# Patient Record
Sex: Male | Born: 1950 | Race: White | Hispanic: No
Health system: Southern US, Community
[De-identification: ages and names within clinical notes are randomized; demographics above are authoritative.]

## PROBLEM LIST (undated history)

## (undated) DIAGNOSIS — I10 Essential (primary) hypertension: Secondary | ICD-10-CM

## (undated) HISTORY — DX: Essential (primary) hypertension: I10

## (undated) HISTORY — PX: NO PAST SURGERIES: SHX2092

---

## 2010-10-08 ENCOUNTER — Emergency Department: Payer: Self-pay | Admitting: Emergency Medicine

## 2012-09-23 ENCOUNTER — Ambulatory Visit: Payer: Self-pay | Admitting: Physician Assistant

## 2017-01-22 ENCOUNTER — Ambulatory Visit
Admission: EM | Admit: 2017-01-22 | Discharge: 2017-01-22 | Disposition: A | Discharge status: Home / Self Care | Payer: Self-pay | Attending: Family Medicine | Admitting: Family Medicine

## 2017-01-22 ENCOUNTER — Other Ambulatory Visit: Payer: Self-pay

## 2017-01-22 ENCOUNTER — Ambulatory Visit (INDEPENDENT_AMBULATORY_CARE_PROVIDER_SITE_OTHER): Payer: Self-pay

## 2017-01-22 DIAGNOSIS — M79671 Pain in right foot: Secondary | ICD-10-CM

## 2017-01-22 MED ORDER — NAPROXEN 500 MG PO TABS: 500.0000 mg | ORAL_TABLET | Freq: Two times a day (BID) | ORAL | 0 refills | Status: DC | PRN

## 2017-01-22 NOTE — Discharge Instructions (Signed)
Xray was negative for fracture.  Rest.  Shoe inserts.  Use the medication as needed.  If you fail to improve, you will need to see podiatry.  Take care  Dr. Adriana Simasook

## 2017-01-22 NOTE — ED Triage Notes (Signed)
Pt reports 2 day hx of right foot pain. Denies injury. Pain is to center of foot. Limping gait in triage. Pain 10/10

## 2017-01-22 NOTE — ED Provider Notes (Signed)
[facility]   CSN: [id number] Arrival date & time: 01/22/17  1005  History   Chief Complaint Chief Complaint  Patient presents with  . Foot Pain   HPI  67 year old male presents with foot pain.  Patient reports a 2-day history of right foot pain.  Pain is located on the plantar aspect of his foot near the MTP joints.  He reports he is having difficulty walking.  Pain is 10/10 in severity.  No known inciting factor.  He does walk upwards of 5 miles a day.  No fall, trauma, injury.  No medications or interventions tried.  No reports of ankle pain.  No other associated symptoms.  No other complaints at this time.  PMH - No reported medical problems. Of note, BP elevated today.  Surgical Hx - No past surgeries.  Home Medications    Prior to Admission medications   Medication Sig Start Date End Date Taking? Authorizing Provider  naproxen (NAPROSYN) 500 MG tablet Take 1 tablet (500 mg total) by mouth 2 (two) times daily as needed for moderate pain. 01/22/17   Tommie Sams, DO   Family History Family History  Problem Relation Age of Onset  . Hypertension Mother   . Hypertension Father    Social History Social History   Tobacco Use  . Smoking status: Never Smoker  . Smokeless tobacco: Never Used  Substance Use Topics  . Alcohol use: No    Frequency: Never  . Drug use: No   Allergies   Patient has no known allergies.   Review of Systems Review of Systems  Constitutional: Negative.   Musculoskeletal:       Foot pain.   Physical Exam Triage Vital Signs ED Triage Vitals  Enc Vitals Group     BP 01/22/17 1019 (!) 179/94     Pulse Rate 01/22/17 1019 93     Resp 01/22/17 1019 20     Temp 01/22/17 1019 97.9 F (36.6 C)     Temp Source 01/22/17 1019 Oral     SpO2 01/22/17 1019 100 %     Weight 01/22/17 1015 170 lb (77.1 kg)     Height 01/22/17 1015 5\' 8"  (1.727 m)     Head Circumference --      Peak Flow --      Pain Score 01/22/17 1016 10     Pain Loc  --      Pain Edu? --      Excl. in GC? --    Updated Vital Signs BP (!) 179/94 (BP Location: Right Arm)   Pulse 93   Temp 97.9 F (36.6 C) (Oral)   Resp 20   Ht 5\' 8"  (1.727 m)   Wt 170 lb (77.1 kg)   SpO2 100%   BMI 25.85 kg/m   Physical Exam  Constitutional: He is oriented to person, place, and time. He appears well-developed and well-nourished. No distress.  Cardiovascular: Normal rate and regular rhythm.  No murmur heard. Pulmonary/Chest: Effort normal and breath sounds normal. He has no wheezes. He has no rales.  Musculoskeletal:  Right foot -hallux valgus noted.  Cannot appreciate pulses but normal cap refill.  Normal range of motion the ankle.  Nontender.  Patient has no discrete areas of tenderness of his foot.  He does have difficulty bearing weight on the forefoot.  Neurological: He is alert and oriented to person, place, and time.  Skin: Skin is warm. No rash noted.  Psychiatric: He has a normal  mood and affect. His behavior is normal.  Nursing note and vitals reviewed.    UC Treatments / Results  Labs (all labs ordered are listed, but only abnormal results are displayed) Labs Reviewed - No data to display  EKG  EKG Interpretation None       Radiology Dg Foot Complete Right  Result Date: 01/22/2017 CLINICAL DATA:  Right foot pain. EXAM: RIGHT FOOT COMPLETE - 3+ VIEW COMPARISON:  None FINDINGS: There is a hallux valgus deformity with advanced degenerative changes involving the first MTP joint. No acute fracture or subluxation identified. No radio-opaque foreign bodies or soft tissue calcifications. IMPRESSION: 1. No acute abnormality. 2. Marked first MTP joint osteoarthritis. Electronically Signed   By: Signa Kellaylor  Stroud M.D.   On: 01/22/2017 10:48    Procedures Procedures (including critical care time)  Medications Ordered in UC Medications - No data to display   Initial Impression / Assessment and Plan / UC Course  I have reviewed the triage vital  signs and the nursing notes.  Pertinent labs & imaging results that were available during my care of the patient were reviewed by me and considered in my medical decision making (see chart for details).     67 year old male presents with right foot pain.  X-ray negative for fracture but revealed first MTP joint also arthritis.  Advised rest and she will inserts.  Naproxen as directed.  Final Clinical Impressions(s) / UC Diagnoses   Final diagnoses:  Right foot pain    ED Discharge Orders        Ordered    naproxen (NAPROSYN) 500 MG tablet  2 times daily PRN     01/22/17 1100     Controlled Substance Prescriptions [place] Controlled Substance Registry consulted? Not Applicable   Tommie SamsCook, Liba Hulsey G, DO 01/22/17 1111

## 2018-12-13 ENCOUNTER — Emergency Department: Payer: Medicare Other

## 2018-12-13 ENCOUNTER — Inpatient Hospital Stay
Admission: EM | Admit: 2018-12-13 | Discharge: 2018-12-16 | DRG: 069 | Disposition: A | Discharge status: Home / Self Care | Payer: Medicare Other | Attending: Internal Medicine | Admitting: Internal Medicine

## 2018-12-13 ENCOUNTER — Other Ambulatory Visit: Payer: Self-pay

## 2018-12-13 DIAGNOSIS — F840 Autistic disorder: Secondary | ICD-10-CM | POA: Diagnosis present

## 2018-12-13 DIAGNOSIS — G459 Transient cerebral ischemic attack, unspecified: Secondary | ICD-10-CM

## 2018-12-13 DIAGNOSIS — F329 Major depressive disorder, single episode, unspecified: Secondary | ICD-10-CM | POA: Diagnosis present

## 2018-12-13 DIAGNOSIS — D72829 Elevated white blood cell count, unspecified: Secondary | ICD-10-CM | POA: Diagnosis not present

## 2018-12-13 DIAGNOSIS — R42 Dizziness and giddiness: Secondary | ICD-10-CM

## 2018-12-13 DIAGNOSIS — H5500 Unspecified nystagmus: Secondary | ICD-10-CM | POA: Diagnosis present

## 2018-12-13 DIAGNOSIS — Z20828 Contact with and (suspected) exposure to other viral communicable diseases: Secondary | ICD-10-CM | POA: Diagnosis present

## 2018-12-13 DIAGNOSIS — E785 Hyperlipidemia, unspecified: Secondary | ICD-10-CM | POA: Diagnosis present

## 2018-12-13 DIAGNOSIS — Z8249 Family history of ischemic heart disease and other diseases of the circulatory system: Secondary | ICD-10-CM

## 2018-12-13 DIAGNOSIS — Z79899 Other long term (current) drug therapy: Secondary | ICD-10-CM

## 2018-12-13 DIAGNOSIS — F609 Personality disorder, unspecified: Secondary | ICD-10-CM | POA: Diagnosis present

## 2018-12-13 DIAGNOSIS — I6503 Occlusion and stenosis of bilateral vertebral arteries: Secondary | ICD-10-CM

## 2018-12-13 DIAGNOSIS — I160 Hypertensive urgency: Secondary | ICD-10-CM | POA: Diagnosis present

## 2018-12-13 DIAGNOSIS — G450 Vertebro-basilar artery syndrome: Secondary | ICD-10-CM | POA: Diagnosis present

## 2018-12-13 LAB — CBC
HCT: 44.3 % (ref 39.0–52.0)
Hemoglobin: 15.5 g/dL (ref 13.0–17.0)
MCH: 30.7 pg (ref 26.0–34.0)
MCHC: 35 g/dL (ref 30.0–36.0)
MCV: 87.7 fL (ref 80.0–100.0)
Platelets: 304 10*3/uL (ref 150–400)
RBC: 5.05 MIL/uL (ref 4.22–5.81)
RDW: 12.6 % (ref 11.5–15.5)
WBC: 11 10*3/uL — ABNORMAL HIGH (ref 4.0–10.5)
nRBC: 0 % (ref 0.0–0.2)

## 2018-12-13 LAB — HEPATIC FUNCTION PANEL
ALT: 24 U/L (ref 0–44)
AST: 27 U/L (ref 15–41)
Albumin: 4.7 g/dL (ref 3.5–5.0)
Alkaline Phosphatase: 71 U/L (ref 38–126)
Bilirubin, Direct: <0.1 mg/dL (ref 0.0–0.2)
Total Bilirubin: 0.6 mg/dL (ref 0.3–1.2)
Total Protein: 8.3 g/dL — ABNORMAL HIGH (ref 6.5–8.1)

## 2018-12-13 LAB — BASIC METABOLIC PANEL
Anion gap: 12 (ref 5–15)
BUN: 25 mg/dL — ABNORMAL HIGH (ref 8–23)
CO2: 23 mmol/L (ref 22–32)
Calcium: 9.7 mg/dL (ref 8.9–10.3)
Chloride: 106 mmol/L (ref 98–111)
Creatinine, Ser: 1.03 mg/dL (ref 0.61–1.24)
GFR calc Af Amer: >60 mL/min (ref >60)
GFR calc non Af Amer: >60 mL/min (ref >60)
Glucose, Bld: 145 mg/dL — ABNORMAL HIGH (ref 70–99)
Potassium: 3.7 mmol/L (ref 3.5–5.1)
Sodium: 141 mmol/L (ref 135–145)

## 2018-12-13 LAB — TROPONIN I (HIGH SENSITIVITY)
Troponin I (High Sensitivity): 4 ng/L (ref <18)
Troponin I (High Sensitivity): 6 ng/L (ref <18)

## 2018-12-13 IMAGING — CT CT HEAD W/O CM
3 series · 16 of 47 positions shown, 19 images · non-contrast
Comparison: None.

CLINICAL DATA: Dizziness, nausea, vomiting

EXAM:
CT HEAD WITHOUT CONTRAST
TECHNIQUE: Contiguous axial images were obtained from the base of the skull
through the vertex without intravenous contrast.

[Series 3: head wo · axial · 0.42mm/px · z∈[+647,+772]mm · 10 of 31 slices shown, 13 images]
[im 3/31  brain]
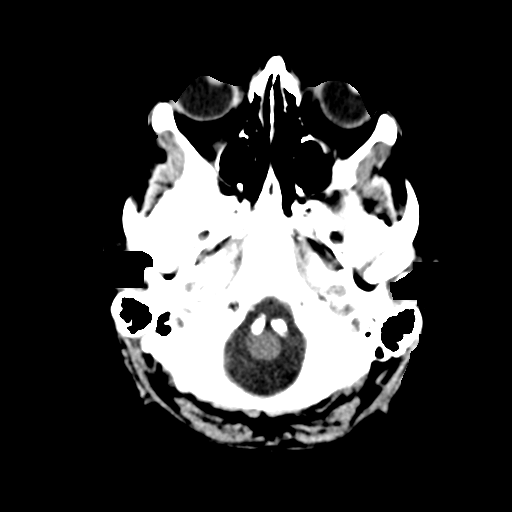
[im 3/31  bone]
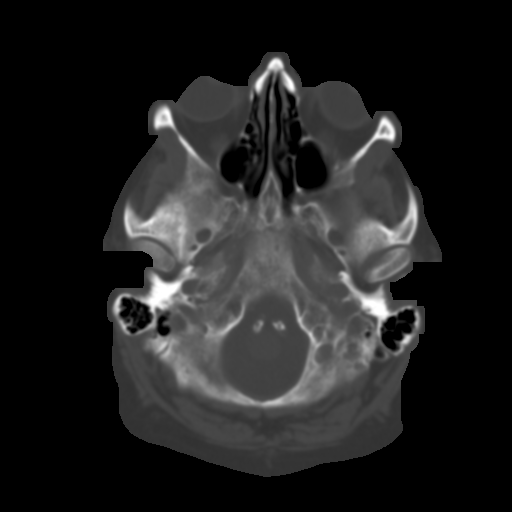
[im 6/31  brain]
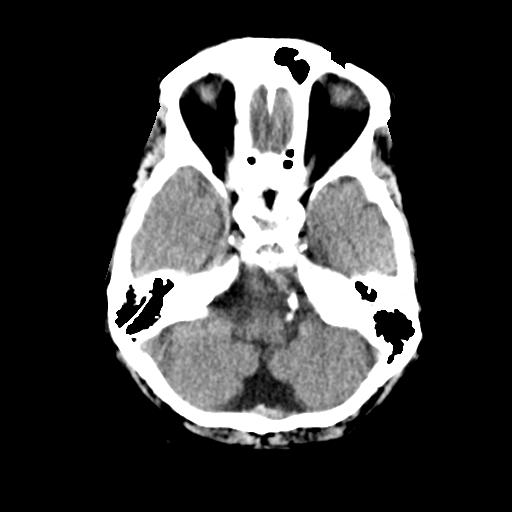
[im 9/31  brain]
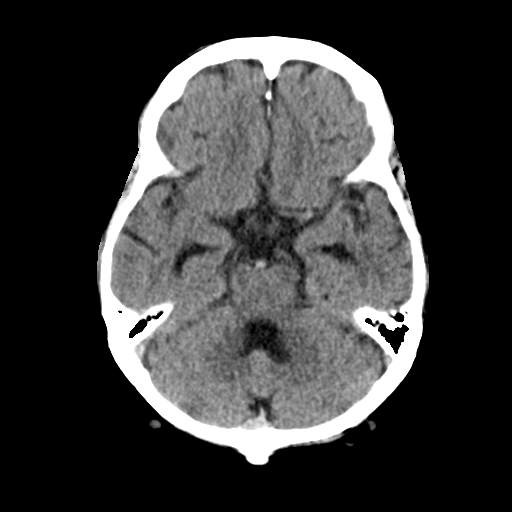
[im 11/31  brain]
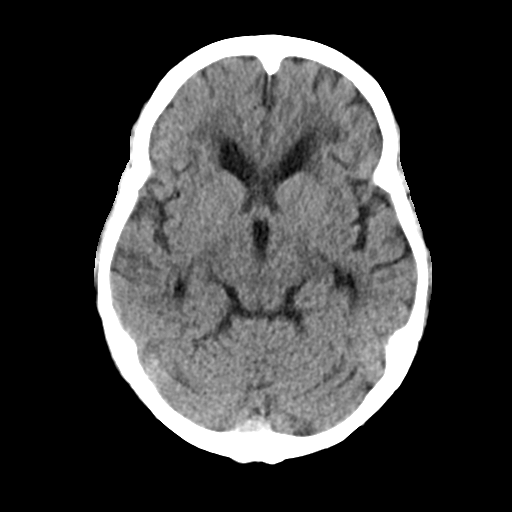
[im 14/31  brain]
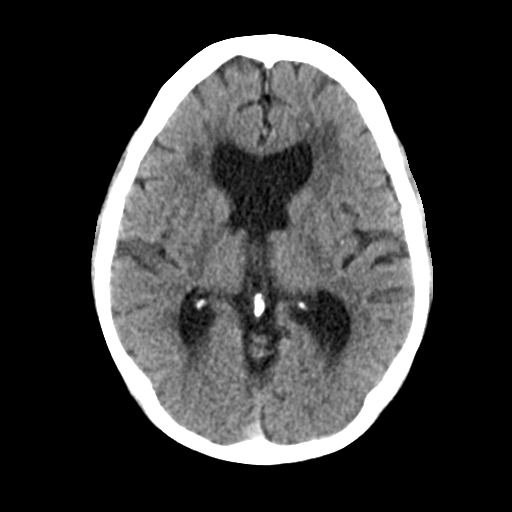
[im 14/31  bone]
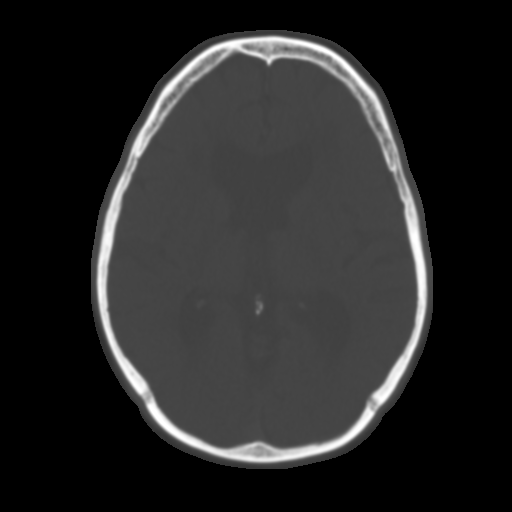
[im 17/31  brain]
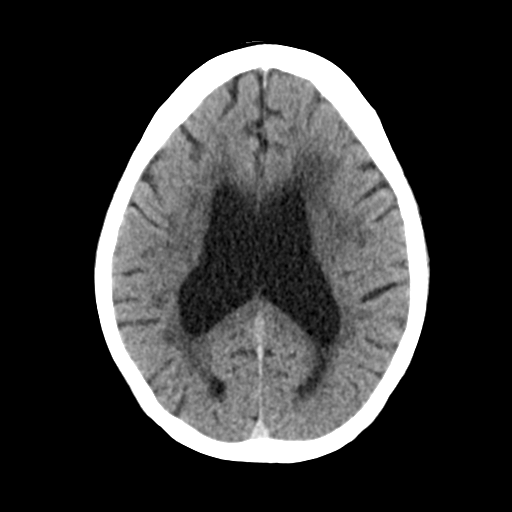
[im 20/31  brain]
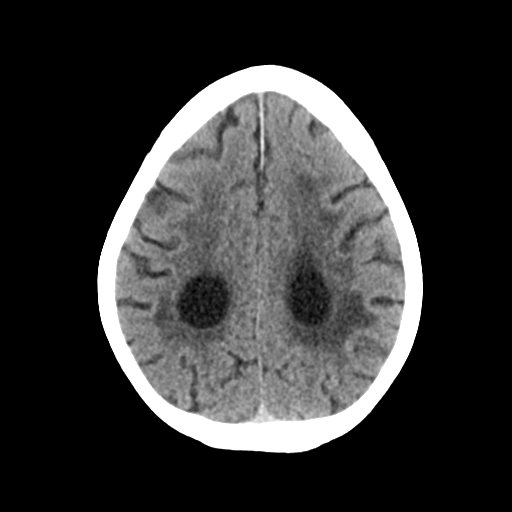
[im 23/31  brain]
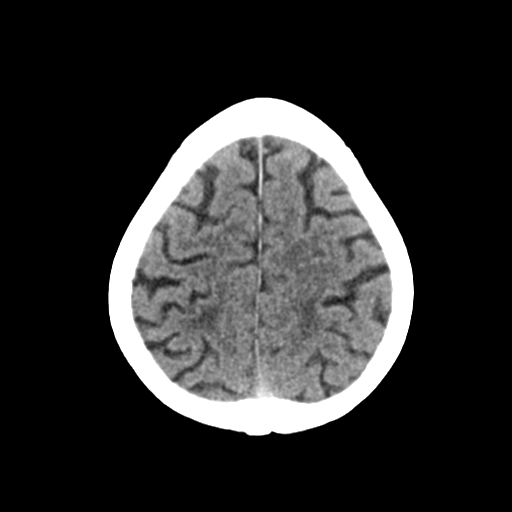
[im 25/31  brain]
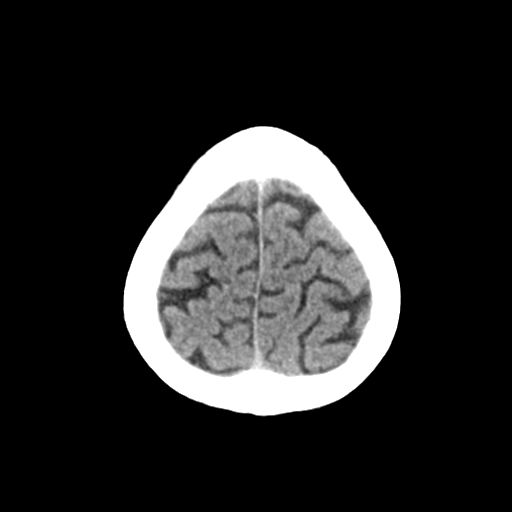
[im 25/31  bone]
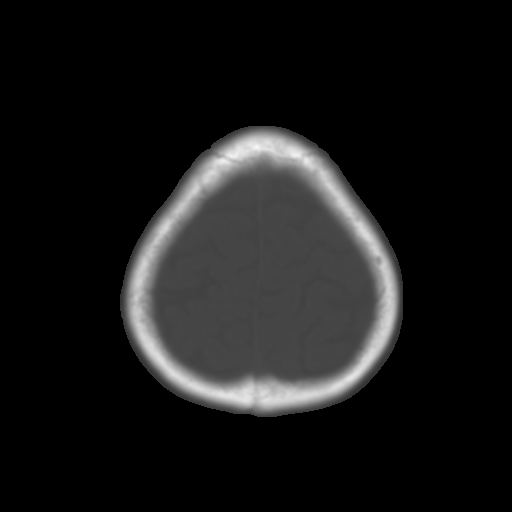
[im 28/31  brain]
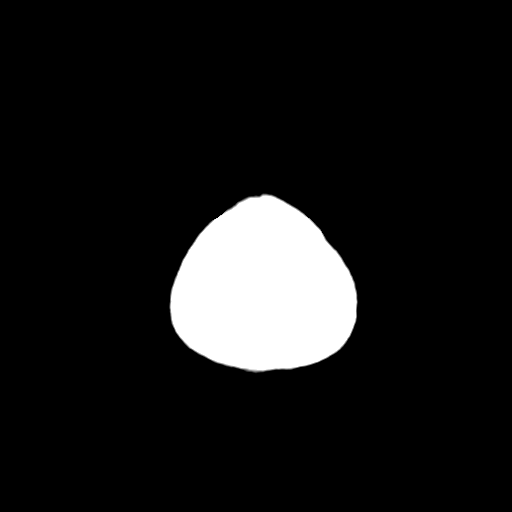

[Series 4: coronal soft tissue · coronal · 0.29mm/px · 3 of 63 slices shown]
[im 21/63  brain]
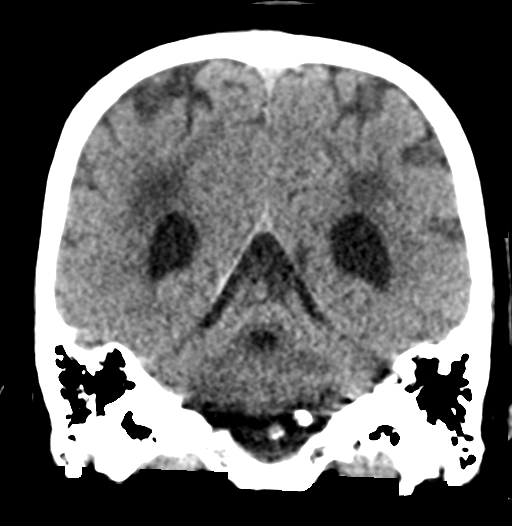
[im 28/63  brain]
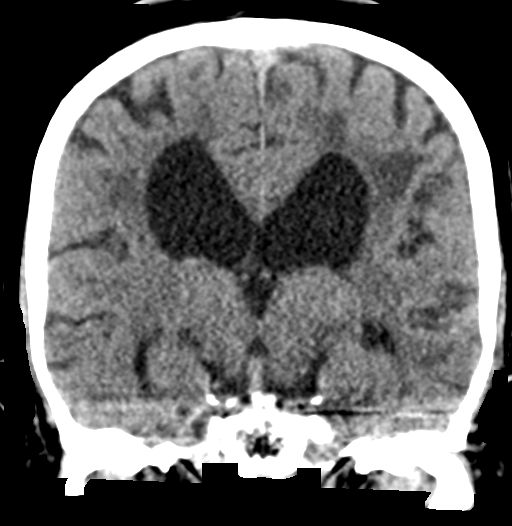
[im 35/63  brain]
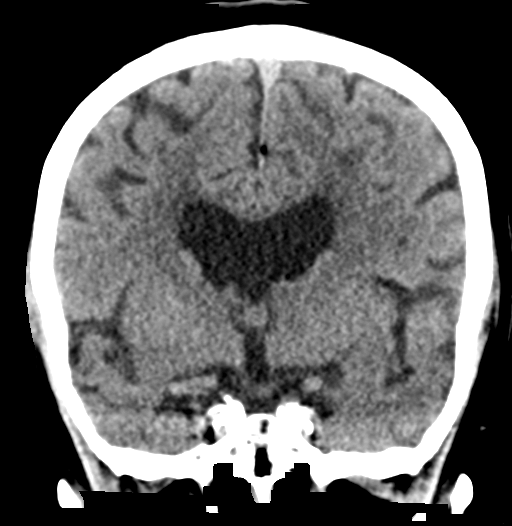

[Series 5: sagittal soft tissue · sagittal · 0.30mm/px · 3 of 51 slices shown]
[im 17/51  brain]
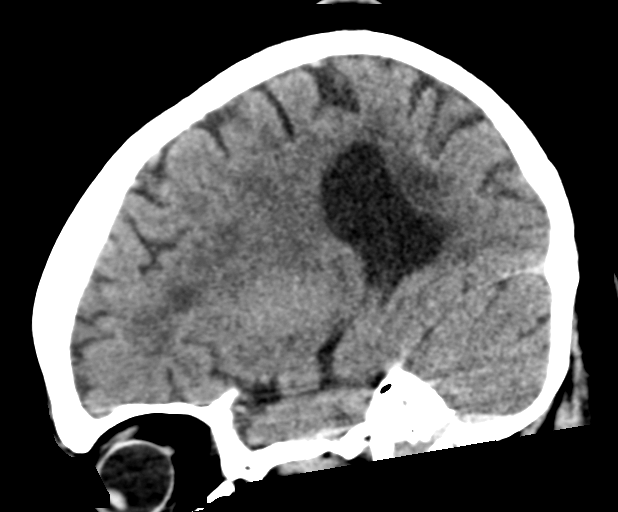
[im 26/51  brain]
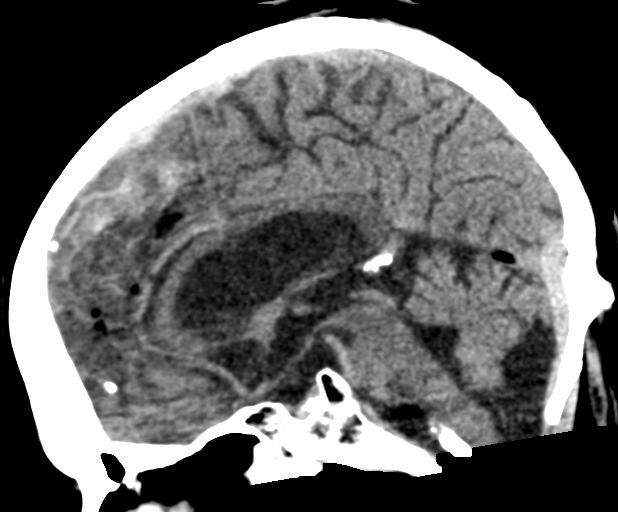
[im 34/51  brain]
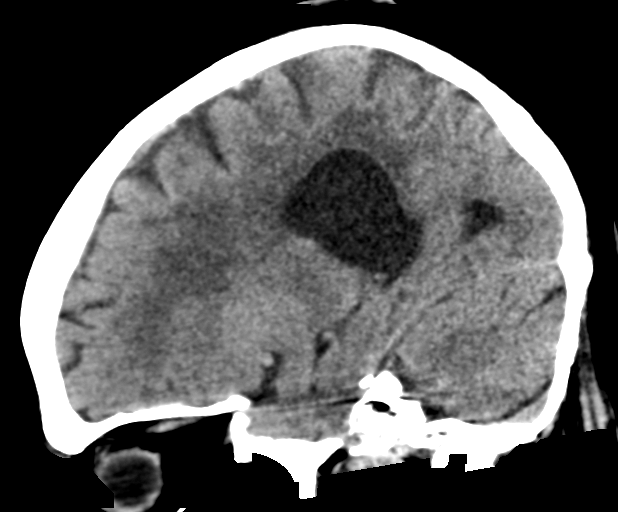

[16 of 47 positions shown; findings below may reference images not displayed]

FINDINGS: Brain: There is atrophy and chronic small vessel disease changes. No
acute intracranial abnormality. Specifically, no hemorrhage,
hydrocephalus, mass lesion, acute infarction, or significant
intracranial injury.

Vascular: No hyperdense vessel or unexpected calcification.

Skull: No acute calvarial abnormality.

Sinuses/Orbits: Visualized paranasal sinuses and mastoids clear.
Orbital soft tissues unremarkable.

Other: None
IMPRESSION: Atrophy, chronic microvascular disease.

No acute intracranial abnormality.

## 2018-12-13 MED ORDER — SODIUM CHLORIDE 0.9% FLUSH: 3.0000 mL | Freq: Once | INTRAVENOUS | Status: DC

## 2018-12-13 MED ORDER — MECLIZINE HCL 25 MG PO TABS
25.0000 mg | ORAL_TABLET | Freq: Once | ORAL | Status: AC
  Administered 2018-12-13: 21:00:00 25 mg via ORAL
  Filled 2018-12-13: qty 1

## 2018-12-13 MED ORDER — SODIUM CHLORIDE 0.9 % IV SOLN
1000.0000 mL | Freq: Once | INTRAVENOUS | Status: AC
  Administered 2018-12-13: 19:00:00 1000 mL via INTRAVENOUS

## 2018-12-13 MED ORDER — ONDANSETRON HCL 4 MG/2ML IJ SOLN
4.0000 mg | Freq: Once | INTRAMUSCULAR | Status: AC
  Administered 2018-12-13: 22:00:00 4 mg via INTRAVENOUS
  Filled 2018-12-13: qty 2

## 2018-12-13 NOTE — ED Notes (Signed)
Neuro cart placed at bedside for consult.

## 2018-12-13 NOTE — ED Notes (Signed)
Pt vomiting as RN brought pt back to triage. Pt reporting this is the third time he has vomited today.

## 2018-12-13 NOTE — ED Notes (Signed)
Neuro consult called in.

## 2018-12-13 NOTE — ED Notes (Signed)
Pt reports no decrease in symptoms and st "I don't feel any better". Pt vomited x1 and was given medication to help, see MAR. Pt reports consistent dizziness.

## 2018-12-13 NOTE — ED Notes (Signed)
Teleneurologist at bedside 

## 2018-12-13 NOTE — ED Provider Notes (Signed)
[facility] Emergency Department Provider Note   ____________________________________________    I have reviewed the triage vital signs and the nursing notes.   HISTORY  Chief Complaint Dizziness     HPI Albert Leon is a 68 y.o. male who presents with complaints of dizziness.  Patient reports that approximately 1 PM he left the local minim[place] and was going to walk back home when he felt his vision go blurry and "his head was buzzing "and he became very dizzy.  He says that he was so dizzy that he could hardly walk.  He does not describe lightheadedness more vertigo-like symptoms.  He also reports nausea and vomiting.  No history of vertigo in the past.  No extremity weakness or sensory changes  History reviewed. No pertinent past medical history.  Patient Active Problem List   Diagnosis Date Noted  . Vertebral artery narrowing, bilateral   . Dizziness   . TIA (transient ischemic attack) 12/14/2018    History reviewed. No pertinent surgical history.  Prior to Admission medications   Medication Sig Start Date End Date Taking? Authorizing Provider  amLODipine (NORVASC) 10 MG tablet Take 1 tablet (10 mg total) by mouth daily. 12/17/18   Tresa Moore, MD  aspirin EC 81 MG EC tablet Take 1 tablet (81 mg total) by mouth daily. 12/17/18   Tresa Moore, MD  carvedilol (COREG) 12.5 MG tablet Take 1 tablet (12.5 mg total) by mouth 2 (two) times daily. 12/16/18 12/16/19  Tresa Moore, MD  clopidogrel (PLAVIX) 75 MG tablet Take 1 tablet (75 mg total) by mouth daily. 12/17/18   Tresa Moore, MD  rosuvastatin (CRESTOR) 40 MG tablet Take 1 tablet (40 mg total) by mouth daily. 12/16/18   Tresa Moore, MD     Allergies Patient has no known allergies.  Family History  Problem Relation Age of Onset  . Hypertension Mother   . Hypertension Father     Social History Social History   Tobacco Use  . Smoking status: Never  Smoker  . Smokeless tobacco: Never Used  Substance Use Topics  . Alcohol use: No    Frequency: Never  . Drug use: No    Review of Systems  Constitutional: No fever/chills Eyes: As above ENT: No sore throat. Cardiovascular: Denies chest pain. Respiratory: Denies shortness of breath. Gastrointestinal: No abdominal pain.  Positive nausea Genitourinary: Negative for dysuria. Musculoskeletal: Negative for back pain. Skin: Negative for rash. Neurological: "Buzzing in head ", ataxia   ____________________________________________   PHYSICAL EXAM:  VITAL SIGNS: ED Triage Vitals  Enc Vitals Group     BP 12/13/18 1624 (!) 184/81     Pulse Rate 12/13/18 1624 63     Resp 12/13/18 1624 18     Temp 12/13/18 1624 97.8 F (36.6 C)     Temp Source 12/13/18 1624 Oral     SpO2 12/13/18 1624 98 %     Weight 12/13/18 1621 74.8 kg (165 lb)     Height 12/13/18 1621 1.727 m (5\' 8" )     Head Circumference --      Peak Flow --      Pain Score 12/13/18 1621 0     Pain Loc --      Pain Edu? --      Excl. in GC? --     Constitutional: Alert and oriented.  Eyes: Conjunctivae are normal.  EOMI, horizontal nystagmus noted, PERRLA Head: Atraumatic. Nose: No congestion/rhinnorhea. Mouth/Throat:  Mucous membranes are moist.   Neck:  Painless ROM Cardiovascular: Normal rate, regular rhythm. Grossly normal heart sounds.  Good peripheral circulation. Respiratory: Normal respiratory effort.  No retractions. Gastrointestinal: Soft and nontender. No distention.  No CVA tenderness. Genitourinary: deferred Musculoskeletal: Normal strength in all extremities Neurologic:  Normal speech and language. No gross focal neurologic deficits are appreciated.  No dysdiadochokinesis, patient is able to stand but feels unsteady with walking, cranial nerves II to XII are normal Skin:  Skin is warm, dry and intact. No rash noted. Psychiatric: Mood and affect are normal. Speech and behavior are normal.   ____________________________________________   LABS (all labs ordered are listed, but only abnormal results are displayed)  Labs Reviewed  BASIC METABOLIC PANEL - Abnormal; Notable for the following components:      Result Value   Glucose, Bld 145 (*)    BUN 25 (*)    All other components within normal limits  CBC - Abnormal; Notable for the following components:   WBC 11.0 (*)    All other components within normal limits  URINALYSIS, COMPLETE (UACMP) WITH MICROSCOPIC - Abnormal; Notable for the following components:   Color, Urine YELLOW (*)    APPearance CLEAR (*)    Hgb urine dipstick SMALL (*)    Ketones, ur 20 (*)    All other components within normal limits  HEPATIC FUNCTION PANEL - Abnormal; Notable for the following components:   Total Protein 8.3 (*)    All other components within normal limits  LIPID PANEL - Abnormal; Notable for the following components:   Cholesterol 227 (*)    LDL Cholesterol 167 (*)    All other components within normal limits  SARS CORONAVIRUS 2 (TAT 6-24 HRS)  URINE DRUG SCREEN, QUALITATIVE ([facility] ONLY)  HIV ANTIBODY (ROUTINE TESTING W REFLEX)  HEMOGLOBIN A1C  CBC  CREATININE, SERUM  TROPONIN I (HIGH SENSITIVITY)  TROPONIN I (HIGH SENSITIVITY)   ____________________________________________  EKG  ED ECG REPORT I, Lavonia Drafts, the attending physician, personally viewed and interpreted this ECG.  Date: 12/13/2018  Rhythm: normal sinus rhythm QRS Axis: normal Intervals: normal ST/T Wave abnormalities: normal Narrative Interpretation: no evidence of acute ischemia  ____________________________________________  RADIOLOGY  CT head unremarkable ____________________________________________   PROCEDURES  Procedure(s) performed: No  Procedures   Critical Care performed: No ____________________________________________   INITIAL IMPRESSION / ASSESSMENT AND PLAN / ED COURSE  Pertinent labs & imaging results that were  available during my care of the patient were reviewed by me and considered in my medical decision making (see chart for details).  Patient presents with dizziness which seems most compatible with vertigo, relatively acute onset, no history of similar episodes.  Differential also includes CVA.  Neurologically intact, CT head overall reassuring.  However given age history of high blood pressure, enough concern for CVA we will obtain MRI.  Will treat with meclizine, IV fluids to see if this helps while we await further imaging.  Clinical Course as of Dec 15 2005  Mon Dec 13, 2018  2114 Hepatic function panel [SF]  2239 MR BRAIN WO CONTRAST [SF]    Clinical Course User Index [SF] Caryn Section Linden Dolin, PA-C   ____________________________________________   FINAL CLINICAL IMPRESSION(S) / ED DIAGNOSES  Final diagnoses:  Dizziness  Vertebral artery narrowing, bilateral  Vertebral artery insufficiency        Note:  This document was prepared using Dragon voice recognition software and may include unintentional dictation errors.   Lavonia Drafts, MD 12/16/18  2009  

## 2018-12-13 NOTE — ED Notes (Addendum)
Pt gave verbal permission to this RN to update said pt on his current condition.  Per family, the patient has mild autism and at times has trouble focusing however is able to take care of himself independently. Family notes that they had contact with him a few days prior and did not notice any changes from him baseline

## 2018-12-13 NOTE — Consult Note (Signed)
[facility] Tele[facility]urology Consult Services  Stat Consult  Date of Service:   12/13/2018 22:36:16  Impression:     .  Rule Out Acute Ischemic Stroke  Comments/Sign-Out: rt gaze nystagmus, diplopia on rt gaze MRI negative for restricted diffusion # evaluate for vertebrobasilar insufficiency (pt has heavily calcified b/l vertebral arteries) # Evaluate for acute ischemic stroke # Evaluate for peripherral vertigo # Evaluate for toxic metabolic encephalopathy # cerebral small vessel ischemi  CT HEAD: Showed No Acute Hemorrhage or Acute Core Infarct MRI brain: 1. Negative for acute infarct or acute intracranial abnormality. 2. Heavily calcified and dolichoectatic distal vertebral arteries. There is no evidence of a prior posterior circulation infarct, but consider the possibility of vertebrobasilar insufficiency in this clinical setting. 3. Moderate for age nonspecific cerebral white matter signal changes, most commonly due to chronic small vessel disease  Metrics: [facility] Notification Time: 12/13/2018 22:33:37 Stamp Time: 12/13/2018 22:36:16 Callback Response Time: 12/13/2018 22:37:20 Video Start Time: 12/13/2018 23:01:35 Video End Time: 12/13/2018 14:48:18  Our recommendations are outlined below.  Recommendations:     .  Initiate Aspirin 81 MG Daily     .  Initiate Plavix 75 MG Daily     .  Repat MRI after a couple of days as delayed restricted diffusion can happen with posterior circulation strokes     .  CTA head and neck or MRA head and neck wo     .  vascular surgery consult     .  Lipitor 80  Imaging Studies:     .  Echocardiogram - Transthoracic Echocardiogram  Therapies:     .  Physical Therapy, Occupational Therapy, Speech Therapy Assessment When Applicable  Other WorkUp:     .  Infectious/metabolic workup per primary team  Disposition: Neurology Follow Up Recommended  Sign Out:     .  Discussed with Emergency Department  Provider  ----------------------------------------------------------------------------------------------------  Chief Complaint: Dizzy episode  History of Present Illness: Patient is a 68 year old Male.  h/o mild Autism, Dizzy episode with "burning sensation' in his brain. Pt is c/o Vomiting, and blurred vison. He is being treated symptomatically with Meclizine in the ED. Pt was walking when his symtoms started. He sat down and then started seeing double. Pt says he has chronic VFD on the left Upper visiual field CT head: negative. MRI Head: completed.   Past Medical History:     . Hypertension     . Hyperlipidemia   Examination: BP(142/80), Pulse(76), Blood Glucose(135) 1A: Level of Consciousness - Alert; keenly responsive + 0 1B: Ask Month and Age - Both Questions Right + 0 1C: Blink Eyes & Squeeze Hands - Performs Both Tasks + 0 2: Test Horizontal Extraocular Movements - Normal + 0 3: Test Visual Fields - Partial Hemianopia + 1 4: Test Facial Palsy (Use Grimace if Obtunded) - Normal symmetry + 0 5A: Test Left Arm Motor Drift - No Drift for 10 Seconds + 0 5B: Test Right Arm Motor Drift - No Drift for 10 Seconds + 0 6A: Test Left Leg Motor Drift - No Drift for 5 Seconds + 0 6B: Test Right Leg Motor Drift - No Drift for 5 Seconds + 0 7: Test Limb Ataxia (FNF/Heel-Shin) - No Ataxia + 0 8: Test Sensation - Normal; No sensory loss + 0 9: Test Language/Aphasia - Normal; No aphasia + 0 10: Test Dysarthria - Normal + 0 11: Test Extinction/Inattention - No abnormality + 0  NIHSS Score: 1  Patient/Family was informed  the Neurology Consult would happen via TeleHealth consult by way of interactive audio and video telecommunications and consented to receiving care in this manner.  Due to the immediate potential for life-threatening deterioration due to underlying acute neurologic illness, I spent 35 minutes providing critical care. This time includes time for face to face visit via  telemedicine, review of medical records, imaging studies and discussion of findings with providers, the patient and/or family.   Dr Suzi Roots Marlayna Bannister   [facility] [phone]   Case [id number]

## 2018-12-13 NOTE — ED Notes (Signed)
Pt to MRI

## 2018-12-13 NOTE — ED Provider Notes (Signed)
Receiving care from Dr. Sela Hilding, concerns of a stroke.  Patient has not had any relief with the meclizine for his dizziness. Physical Exam  BP (!) 123/93 (BP Location: Left Arm)   Pulse 71   Temp 97.8 F (36.6 C) (Oral)   Resp 16   Ht 5\' 8"  (1.727 m)   Wt 74.8 kg   SpO2 100%   BMI 25.09 kg/m   Physical Exam patient still has severe nystagmus of the right eye, no slurred speech  ED Course/Procedures   Clinical Course as of Dec 12 2245  [facility] Dec 13, 2018  2114 Hepatic function panel [SF]  2239 MR BRAIN WO CONTRAST [SF]    Clinical Course User Index [SF] Versie Starks, PA-C    Procedures  MDM  Due to the MRI showing a questionable vestibular basilar insufficiency , I discussed the case with Dr. Charna Archer.  He suggested I call neurology.  We consulted neurology who suggested we can use teleneurology.  I added additional labs.  Patient was moved to the major side room 3.  Report given to the physician.     Versie Starks, PA-C 12/13/18 2249    Blake Divine, MD 12/23/18 [phone]

## 2018-12-13 NOTE — ED Notes (Signed)
Patient transported to CT 

## 2018-12-13 NOTE — ED Notes (Signed)
Introduced patient to self, explained to patient that prior RN called tele-neuro and cart is at bedside, neurologist should be coming on any time now

## 2018-12-13 NOTE — ED Triage Notes (Signed)
Pt comes into the ED via EMS , pt was walking home in [place] and had sudden onset dizziness with N/V. B/p 142/80, 76HR, temp 99.1, 97% RA, CBG 135

## 2018-12-14 ENCOUNTER — Encounter: Payer: Self-pay | Admitting: Radiology

## 2018-12-14 ENCOUNTER — Inpatient Hospital Stay (HOSPITAL_COMMUNITY)
Admit: 2018-12-14 | Discharge: 2018-12-14 | Disposition: A | Discharge status: Home / Self Care | Payer: Medicare Other | Attending: Internal Medicine | Admitting: Internal Medicine

## 2018-12-14 ENCOUNTER — Emergency Department: Payer: Medicare Other

## 2018-12-14 DIAGNOSIS — Z20828 Contact with and (suspected) exposure to other viral communicable diseases: Secondary | ICD-10-CM | POA: Diagnosis present

## 2018-12-14 DIAGNOSIS — G459 Transient cerebral ischemic attack, unspecified: Secondary | ICD-10-CM | POA: Diagnosis present

## 2018-12-14 DIAGNOSIS — F329 Major depressive disorder, single episode, unspecified: Secondary | ICD-10-CM | POA: Diagnosis present

## 2018-12-14 DIAGNOSIS — I351 Nonrheumatic aortic (valve) insufficiency: Secondary | ICD-10-CM | POA: Diagnosis not present

## 2018-12-14 DIAGNOSIS — F609 Personality disorder, unspecified: Secondary | ICD-10-CM | POA: Diagnosis present

## 2018-12-14 DIAGNOSIS — F840 Autistic disorder: Secondary | ICD-10-CM | POA: Diagnosis present

## 2018-12-14 DIAGNOSIS — G450 Vertebro-basilar artery syndrome: Secondary | ICD-10-CM | POA: Diagnosis present

## 2018-12-14 DIAGNOSIS — D72829 Elevated white blood cell count, unspecified: Secondary | ICD-10-CM | POA: Diagnosis not present

## 2018-12-14 DIAGNOSIS — Z79899 Other long term (current) drug therapy: Secondary | ICD-10-CM | POA: Diagnosis not present

## 2018-12-14 DIAGNOSIS — R42 Dizziness and giddiness: Secondary | ICD-10-CM | POA: Diagnosis present

## 2018-12-14 DIAGNOSIS — I160 Hypertensive urgency: Secondary | ICD-10-CM | POA: Diagnosis present

## 2018-12-14 DIAGNOSIS — H5500 Unspecified nystagmus: Secondary | ICD-10-CM | POA: Diagnosis present

## 2018-12-14 DIAGNOSIS — Z8249 Family history of ischemic heart disease and other diseases of the circulatory system: Secondary | ICD-10-CM | POA: Diagnosis not present

## 2018-12-14 DIAGNOSIS — E785 Hyperlipidemia, unspecified: Secondary | ICD-10-CM | POA: Diagnosis present

## 2018-12-14 DIAGNOSIS — I340 Nonrheumatic mitral (valve) insufficiency: Secondary | ICD-10-CM

## 2018-12-14 DIAGNOSIS — I6503 Occlusion and stenosis of bilateral vertebral arteries: Secondary | ICD-10-CM | POA: Diagnosis not present

## 2018-12-14 LAB — CBC
HCT: 40.5 % (ref 39.0–52.0)
Hemoglobin: 14 g/dL (ref 13.0–17.0)
MCH: 30.3 pg (ref 26.0–34.0)
MCHC: 34.6 g/dL (ref 30.0–36.0)
MCV: 87.7 fL (ref 80.0–100.0)
Platelets: 279 10*3/uL (ref 150–400)
RBC: 4.62 MIL/uL (ref 4.22–5.81)
RDW: 12.7 % (ref 11.5–15.5)
WBC: 8.8 10*3/uL (ref 4.0–10.5)
nRBC: 0 % (ref 0.0–0.2)

## 2018-12-14 LAB — ECHOCARDIOGRAM COMPLETE
Height: 68 in
Weight: 2640 oz

## 2018-12-14 LAB — LIPID PANEL
Cholesterol: 227 mg/dL — ABNORMAL HIGH (ref 0–200)
HDL: 45 mg/dL (ref >40)
LDL Cholesterol: 167 mg/dL — ABNORMAL HIGH (ref 0–99)
Total CHOL/HDL Ratio: 5 RATIO
Triglycerides: 75 mg/dL (ref <150)
VLDL: 15 mg/dL (ref 0–40)

## 2018-12-14 LAB — URINALYSIS, COMPLETE (UACMP) WITH MICROSCOPIC
Bacteria, UA: NONE SEEN
Bilirubin Urine: NEGATIVE
Glucose, UA: NEGATIVE mg/dL
Ketones, ur: 20 mg/dL — AB
Leukocytes,Ua: NEGATIVE
Nitrite: NEGATIVE
Protein, ur: NEGATIVE mg/dL
Specific Gravity, Urine: 1.018 (ref 1.005–1.030)
Squamous Epithelial / LPF: NONE SEEN (ref 0–5)
pH: 6 (ref 5.0–8.0)

## 2018-12-14 LAB — URINE DRUG SCREEN, QUALITATIVE (ARMC ONLY)
Amphetamines, Ur Screen: NOT DETECTED
Barbiturates, Ur Screen: NOT DETECTED
Benzodiazepine, Ur Scrn: NOT DETECTED
Cannabinoid 50 Ng, Ur ~~LOC~~: NOT DETECTED
Cocaine Metabolite,Ur ~~LOC~~: NOT DETECTED
MDMA (Ecstasy)Ur Screen: NOT DETECTED
Methadone Scn, Ur: NOT DETECTED
Opiate, Ur Screen: NOT DETECTED
Phencyclidine (PCP) Ur S: NOT DETECTED
Tricyclic, Ur Screen: NOT DETECTED

## 2018-12-14 LAB — CREATININE, SERUM
Creatinine, Ser: 0.71 mg/dL (ref 0.61–1.24)
GFR calc Af Amer: >60 mL/min (ref >60)
GFR calc non Af Amer: >60 mL/min (ref >60)

## 2018-12-14 LAB — SARS CORONAVIRUS 2 (TAT 6-24 HRS): SARS Coronavirus 2: NEGATIVE

## 2018-12-14 LAB — HIV ANTIBODY (ROUTINE TESTING W REFLEX): HIV Screen 4th Generation wRfx: NONREACTIVE

## 2018-12-14 LAB — HEMOGLOBIN A1C
Hgb A1c MFr Bld: 5.2 % (ref 4.8–5.6)
Mean Plasma Glucose: 102.54 mg/dL

## 2018-12-14 MED ORDER — HEPARIN SODIUM (PORCINE) 5000 UNIT/ML IJ SOLN
5000.0000 [IU] | Freq: Three times a day (TID) | INTRAMUSCULAR | Status: DC
  Administered 2018-12-14 – 2018-12-16 (×7): 5000 [IU] via SUBCUTANEOUS
  Filled 2018-12-14 (×10): qty 1

## 2018-12-14 MED ORDER — SENNOSIDES-DOCUSATE SODIUM 8.6-50 MG PO TABS: 1.0000 | ORAL_TABLET | Freq: Every evening | ORAL | Status: DC | PRN

## 2018-12-14 MED ORDER — STROKE: EARLY STAGES OF RECOVERY BOOK: Freq: Once | Status: DC

## 2018-12-14 MED ORDER — ACETAMINOPHEN 650 MG RE SUPP: 650.0000 mg | RECTAL | Status: DC | PRN

## 2018-12-14 MED ORDER — CLOPIDOGREL BISULFATE 75 MG PO TABS
75.0000 mg | ORAL_TABLET | Freq: Every day | ORAL | Status: DC
  Administered 2018-12-14 – 2018-12-16 (×3): 75 mg via ORAL
  Filled 2018-12-14 (×4): qty 1

## 2018-12-14 MED ORDER — ACETAMINOPHEN 325 MG PO TABS: 650.0000 mg | ORAL_TABLET | ORAL | Status: DC | PRN

## 2018-12-14 MED ORDER — ONDANSETRON HCL 4 MG/2ML IJ SOLN
4.0000 mg | Freq: Four times a day (QID) | INTRAMUSCULAR | Status: DC | PRN
  Administered 2018-12-14: 4 mg via INTRAVENOUS
  Filled 2018-12-14: qty 2

## 2018-12-14 MED ORDER — ASPIRIN EC 81 MG PO TBEC
81.0000 mg | DELAYED_RELEASE_TABLET | Freq: Every day | ORAL | Status: DC
  Administered 2018-12-14 – 2018-12-16 (×3): 81 mg via ORAL
  Filled 2018-12-14 (×3): qty 1

## 2018-12-14 MED ORDER — ACETAMINOPHEN 160 MG/5ML PO SOLN
650.0000 mg | ORAL | Status: DC | PRN
  Filled 2018-12-14: qty 20.3

## 2018-12-14 MED ORDER — SODIUM CHLORIDE 0.9 % IV SOLN
INTRAVENOUS | Status: DC
  Administered 2018-12-14 – 2018-12-15 (×2): via INTRAVENOUS

## 2018-12-14 MED ORDER — IOHEXOL 350 MG/ML SOLN
75.0000 mL | Freq: Once | INTRAVENOUS | Status: AC | PRN
  Administered 2018-12-14: 75 mL via INTRAVENOUS
  Filled 2018-12-14: qty 75

## 2018-12-14 NOTE — Evaluation (Signed)
Occupational Therapy Evaluation Patient Details Name: Albert Leon MRN: [id number] DOB: July 20, 1950 Today's Date: 12/14/2018    History of Present Illness pt is a 68 yo male who presented to ED with complaints of dizziness and inability to stand upright, initial head CT negative, MRI negative however significant calcification in the posterior circulation, telemetry neuro noted right sided nystagmus and diplopia. PMH includes personality disorder and depression   Clinical Impression   Pt seen for OT evaluation this date. Prior to hospital admission, pt was I with all ADLs/IADLs with no AD.  Pt lives alone in apt.  Currently pt demonstrates impairments in standing balance and tolerance 2/2 dizziness requiring MIN A to CGA for LB ADLs/ADL mobility.   Pt would benefit from skilled OT to address noted impairments and functional limitations (see below for any additional details) in order to maximize safety and independence while minimizing falls risk and caregiver burden.  Upon hospital discharge, recommend pt discharge to home with intermittent supv from friends or family to ensure his safety.    Follow Up Recommendations  Supervision - Intermittent;No OT follow up    Equipment Recommendations  None recommended by OT    Recommendations for Other Services       Precautions / Restrictions Precautions Precautions: Fall Restrictions Weight Bearing Restrictions: No      Mobility Bed Mobility Overal bed mobility: Independent             General bed mobility comments: no assist needed to get EOB  Transfers         General transfer comment: pt with dizziness on bed mobility with OT and reports he becamce very dizzy on stand with PT. t/f's deferred on OT Assessment    Balance Overall balance assessment: Needs assistance Sitting-balance support: No upper extremity supported Sitting balance-Leahy Scale: Good     Standing balance comment: deferred                          ADL either performed or assessed with clinical judgement   ADL Overall ADL's : Needs assistance/impaired Eating/Feeding: Independent   Grooming: Wash/dry hands;Wash/dry face;Oral care;Set up;Sitting   Upper Body Bathing: Set up;Sitting   Lower Body Bathing: Min guard;Minimal assistance;Sit to/from stand Lower Body Bathing Details (indicate cue type and reason): for stability d/t dizziness based on clinical observation Upper Body Dressing : Independent;Set up;Sitting   Lower Body Dressing: Min guard;Minimal assistance;Sit to/from stand   Toilet Transfer: Min Art therapist Details (indicate cue type and reason): based on clinical observation                 Vision Baseline Vision/History: Wears glasses Wears Glasses: At all times Patient Visual Report: Diplopia;Other (comment)(nystagmus) Vision Assessment?: Yes Eye Alignment: Within Functional Limits Ocular Range of Motion: Within Functional Limits Alignment/Gaze Preference: Within Defined Limits Tracking/Visual Pursuits: Able to track stimulus in all quads without difficulty Saccades: Within functional limits Convergence: Within functional limits Diplopia Assessment: Other (comment)(pt with difficulty describing. States his diplopia comes and goes, states splits in different directions, further assessment needed.)     Perception     Praxis      Pertinent Vitals/Pain Pain Assessment: 0-10 Pain Score: 2  Faces Pain Scale: Hurts a little bit Pain Location: back of head where he reports feeling that his dizziness is stemming from Pain Descriptors / Indicators: Headache Pain Intervention(s): Monitored during session     Hand Dominance     Extremity/Trunk Assessment Upper  Extremity Assessment Upper Extremity Assessment: Overall WFL for tasks assessed;RUE deficits/detail;LUE deficits/detail RUE Deficits / Details: MMT grossly >3/5, gross and fine motor assesses and is intact and equal  bilaterally. LUE Deficits / Details: MMT grossly >3/5, gross and fine motor assesses and is intact and equal bilaterally.   Lower Extremity Assessment Lower Extremity Assessment: Defer to PT evaluation;Overall WFL for tasks assessed       Communication Communication Communication: No difficulties   Cognition Arousal/Alertness: Awake/alert Behavior During Therapy: WFL for tasks assessed/performed Overall Cognitive Status: Within Functional Limits for tasks assessed                                 General Comments: A&O x4   General Comments       Exercises Other Exercises Other Exercises: OT facilitates  education re: role of OT in acute setting. Pt demos good understanding.   Shoulder Instructions      Home Living Family/patient expects to be discharged to:: Private residence Living Arrangements: Alone Available Help at Discharge: Friend(s);Family;Available PRN/intermittently Type of Home: Apartment Home Access: Level entry     Home Layout: One level     Bathroom Shower/Tub: Chief Strategy Officer: Standard     Home Equipment: Grab bars - tub/shower          Prior Functioning/Environment Level of Independence: Independent        Comments: Pt states he was completely indepedent with ADLs/IADLs and walks versus driving. States he was working in [employer] prior to [employer]. Pt states he has since been finding work wherever he can and was cleaning a woman's house prior to calling EMS d/t severe dizziness.        OT Problem List: Decreased activity tolerance;Impaired balance (sitting and/or standing);Impaired vision/perception      OT Treatment/Interventions: Self-care/ADL training;Therapeutic exercise;Energy conservation;DME and/or AE instruction;Therapeutic activities;Patient/family education;Balance training    OT Goals(Current goals can be found in the care plan section) Acute Rehab OT Goals Patient Stated Goal: get  better OT Goal Formulation: With patient Time For Goal Achievement: 12/28/18 Potential to Achieve Goals: Good  OT Frequency: Min 1X/week   Barriers to D/C: Decreased caregiver support          Co-evaluation              AM-PAC OT "6 Clicks" Daily Activity     Outcome Measure Help from another person eating meals?: None Help from another person taking care of personal grooming?: None Help from another person toileting, which includes using toliet, bedpan, or urinal?: A Little Help from another person bathing (including washing, rinsing, drying)?: A Little Help from another person to put on and taking off regular upper body clothing?: None Help from another person to put on and taking off regular lower body clothing?: A Little 6 Click Score: 21   End of Session    Activity Tolerance: Patient tolerated treatment well Patient left: in bed;with call bell/phone within reach  OT Visit Diagnosis: Unsteadiness on feet (R26.81)                Time: 4235-3614 OT Time Calculation (min): 23 min Charges:  OT General Charges $OT Visit: 1 Visit OT Evaluation $OT Eval Low Complexity: 1 Low OT Treatments $Self Care/Home Management : 8-22 mins  Rejeana Brock, MS, OTR/L ascom [phone] 12/14/18, 3:57 PM

## 2018-12-14 NOTE — ED Provider Notes (Signed)
-----------------------------------------   12:16 AM on 12/14/2018 -----------------------------------------  Blood pressure (!) 184/88, pulse 78, temperature 97.8 F (36.6 C), temperature source Oral, resp. rate 16, height 5\' 8"  (1.727 m), weight 74.8 kg, SpO2 96 %.  Assuming care from Dr. Corky Downs.  In short, Albert Leon is a 68 y.o. male with a chief complaint of Dizziness .  Refer to the original H&P for additional details.  The current plan of care is to follow-up MRI of brain to assess for acute stroke to explain his dizziness.  MRI brain did not show any evidence of acute stroke but did show significant calcification and narrowing of bilateral vertebral arteries.  Neurology was consulted who recommended evaluation by teleneurology.  Teleneurologist recommended we obtain CTA of patient's head and neck, although there does not appear to be evidence of a large vessel occlusion given adequate flow on MRI.  While MRI does not show acute stroke, neurology states that this may not appear in areas of posterior circulation for some time after initial event.  He recommends repeat MRI in 2 to 3 days, starting aspirin and Plavix, and consideration of vascular surgery consult as patient may be a surgical candidate.  Case discussed with hospitalist, who accepts patient for admission.    Blake Divine, MD 12/14/18 [phone]

## 2018-12-14 NOTE — H&P (Addendum)
History and Physical    BINNIE VONDERHAAR WUJ:[id number] DOB: 09/03/1950 DOA: 12/13/2018  PCP: Patient, No Pcp Per  Patient coming from: Home  I have personally briefly reviewed patient's old medical records in [facility]  Chief Complaint:refractory acute vertigo.  HPI: PERL KERNEY is a 68 y.o. male with medical history significant of  Personality disorder/depression who presents to the ED with complaints of refractory acute vertigo.  Patient states that he was on his way home when he acutely became very dizzy stated that he was unable to stand upright and had to immediately sit.  He states that symptoms continue to persist and was associated with nausea but no emesis.  He states a passerby assisted him in calling EMS.  He stated that prior to the onset of dizziness he had been in his normal state of health.  He denies any recent URI signs and symptoms any fever chills shortness of breath or chest pain.  He denies any associated vision changes difficulty with swallowing focal weakness or paresthesias associated with these new symptoms.  Notes he is not had symptoms like this in the past.  ED Course:  In ED patient was slated as a code stroke his initial CT head was noted to be negative MRI of the head was also noted to be negative however there was significant calcification in the raise of the posterior circulation.  Evaluation by telemetry neuro due to continued symptoms noted right-sided nystagmus and diplopia there was concern that patient possibly had a posterior circulation event that was causing his current symptoms.  Telemetry neurology recommended admission for continued stroke evaluation.  It was also recommended to start patient on aspirin low-dose as well as Plavix in addition to obtaining a CTA to further evaluate posterior circulation. Pertinent laboratory data noted and negative urine drug tox urinalysis was also noted to be negative troponins also negative White count  noted to be 11  however no noted signs of infection. Review of Systems: As per HPI otherwise 10 point review of systems negative.   History reviewed. No pertinent past medical history. -Depression/personality disorder History reviewed. No pertinent surgical history.   reports that he has never smoked. He has never used smokeless tobacco. He reports that he does not drink alcohol or use drugs.  No Known Allergies  Family History  Problem Relation Age of Onset   Hypertension Mother    Hypertension Father       Sister with hypothyroidism  Prior to Admission medications   Medication Sig Start Date End Date Taking? Authorizing Provider  naproxen (NAPROSYN) 500 MG tablet Take 1 tablet (500 mg total) by mouth 2 (two) times daily as needed for moderate pain. Patient not taking: Reported on 12/14/2018 01/22/17   Tommie Sams, DO    Physical Exam: Vitals:   12/13/18 2300 12/14/18 0000 12/14/18 0015 12/14/18 0100  BP: (!) 184/88 (!) 159/97  (!) 168/93  Pulse: 78 84 82   Resp: 16   20  Temp:    97.6 F (36.4 C)  TempSrc:    Oral  SpO2: 96% 94% 95% 96%  Weight:      Height:        Constitutional: NAD, calm, comfortable Vitals:   12/13/18 2300 12/14/18 0000 12/14/18 0015 12/14/18 0100  BP: (!) 184/88 (!) 159/97  (!) 168/93  Pulse: 78 84 82   Resp: 16   20  Temp:    97.6 F (36.4 C)  TempSrc:  Oral  SpO2: 96% 94% 95% 96%  Weight:      Height:       Eyes: PERRL, lids and conjunctivae normal ENMT: Mucous membranes are moist. Posterior pharynx clear of any exudate or lesions.Normal dentition.  Neck: normal, supple, no masses, no thyromegaly Respiratory: clear to auscultation bilaterally, no wheezing, no crackles. Normal respiratory effort. No accessory muscle use.  Cardiovascular: Regular rate and rhythm, no murmurs / rubs / gallops. No extremity edema. 2+ pedal pulses. No carotid bruits.  Abdomen: no tenderness, no masses palpated. No hepatosplenomegaly. Bowel sounds  positive.  Musculoskeletal: no clubbing / cyanosis. No joint deformity upper and lower extremities. Good ROM, no contractures. Normal muscle tone.  Skin: no rashes, lesions, ulcers. No induration Neurologic: CN 2-12 grossly intact, + horizontal nystagmus`. Sensation intact, DTR normal. Strength 5/5 in all 4.  Psychiatric: Normal judgment and insight. Alert and oriented x 3. Normal mood.    Labs on Admission: I have personally reviewed following labs and imaging studies  CBC: Recent Labs  Lab 12/13/18 1647  WBC 11.0*  HGB 15.5  HCT 44.3  MCV 87.7  PLT 315   Basic Metabolic Panel: Recent Labs  Lab 12/13/18 1647  NA 141  K 3.7  CL 106  CO2 23  GLUCOSE 145*  BUN 25*  CREATININE 1.03  CALCIUM 9.7   GFR: Estimated Creatinine Clearance: 68.3 mL/min (by C-G formula based on SCr of 1.03 mg/dL). Liver Function Tests: Recent Labs  Lab 12/13/18 1647  AST 27  ALT 24  ALKPHOS 71  BILITOT 0.6  PROT 8.3*  ALBUMIN 4.7   No results for input(s): LIPASE, AMYLASE in the last 168 hours. No results for input(s): AMMONIA in the last 168 hours. Coagulation Profile: No results for input(s): INR, PROTIME in the last 168 hours. Cardiac Enzymes: No results for input(s): CKTOTAL, CKMB, CKMBINDEX, TROPONINI in the last 168 hours. BNP (last 3 results) No results for input(s): PROBNP in the last 8760 hours. HbA1C: No results for input(s): HGBA1C in the last 72 hours. CBG: No results for input(s): GLUCAP in the last 168 hours. Lipid Profile: No results for input(s): CHOL, HDL, LDLCALC, TRIG, CHOLHDL, LDLDIRECT in the last 72 hours. Thyroid Function Tests: No results for input(s): TSH, T4TOTAL, FREET4, T3FREE, THYROIDAB in the last 72 hours. Anemia Panel: No results for input(s): VITAMINB12, FOLATE, FERRITIN, TIBC, IRON, RETICCTPCT in the last 72 hours. Urine analysis:    Component Value Date/Time   COLORURINE YELLOW (A) 12/13/2018 2341   APPEARANCEUR CLEAR (A) 12/13/2018 2341    LABSPEC 1.018 12/13/2018 2341   PHURINE 6.0 12/13/2018 2341   GLUCOSEU NEGATIVE 12/13/2018 2341   HGBUR SMALL (A) 12/13/2018 2341   BILIRUBINUR NEGATIVE 12/13/2018 2341   KETONESUR 20 (A) 12/13/2018 2341   PROTEINUR NEGATIVE 12/13/2018 2341   NITRITE NEGATIVE 12/13/2018 2341   LEUKOCYTESUR NEGATIVE 12/13/2018 2341    Radiological Exams on Admission: Ct Angio Head W Or Wo Contrast  Result Date: 12/14/2018 CLINICAL DATA:  68 year old male with sudden onset dizziness with no acute infarct but heavily calcified ectatic distal vertebral arteries on head CT and MRI earlier today. EXAM: CT ANGIOGRAPHY HEAD AND NECK TECHNIQUE: Multidetector CT imaging of the head and neck was performed using the standard protocol during bolus administration of intravenous contrast. Multiplanar CT image reconstructions and MIPs were obtained to evaluate the vascular anatomy. Carotid stenosis measurements (when applicable) are obtained utilizing NASCET criteria, using the distal internal carotid diameter as the denominator. CONTRAST:  91mL OMNIPAQUE IOHEXOL  350 MG/ML SOLN COMPARISON:  brain MRI and head CT earlier tonight. FINDINGS: CT HEAD Brain: Stable gray-white matter differentiation throughout the brain. No acute intracranial abnormality. Calvarium and skull base: No acute osseous abnormality identified. Paranasal sinuses: Visualized paranasal sinuses and mastoids are stable and well pneumatized. Orbits: Visualized orbits and scalp soft tissues are within normal limits. CTA NECK Skeleton: Carious left mandible dentition. No acute osseous abnormality identified. Upper chest: Negative visible upper lungs aside from mild atelectasis. Mild mediastinal lipomatosis. No superior mediastinal lymphadenopathy. Other neck: No acute findings. Aortic arch: Calcified aortic atherosclerosis. 3 vessel arch configuration. Right carotid system: Tortuous brachiocephalic artery with mild plaque and no stenosis. Tortuous proximal right carotid  artery with a kinked appearance at the thoracic inlet but no other stenosis. Soft and calcified plaque at the right ICA origin and bulb with no associated stenosis. Tortuous right ICA just below the skull base. Left carotid system: Mildly tortuous proximal left CCA without stenosis. Partially retropharyngeal course. Calcified plaque at the left ICA origin resulting in less than 50 % stenosis with respect to the distal vessel. Tortuous left ICA just below the skull base. Vertebral arteries: Tortuous proximal right subclavian artery without stenosis. Normal right vertebral artery origin on series 10, image 537. Patent right vertebral artery to the skull base with tortuosity but no stenosis. Mild plaque in the proximal left subclavian artery without stenosis. Normal left vertebral artery origin. Tortuous left V1 segment with a kinked appearance but no other stenosis. Patent left vertebral artery to the skull base with mild tortuosity, no stenosis. CTA HEAD Posterior circulation: Heavily calcified bilateral vertebral artery V4 segments with radiographic string sign stenosis as seen on series 10, images 274, 271 and series 12, images 133 and 131. On the right the high-grade stenosis is proximal to the right PICA origin which remains patent on series 10, image 260. Both vertebral arteries do remain patent to the vertebrobasilar junction, and this despite a 2nd level of tandem string sign stenosis of the distal left V4 segment just proximal to the vertebrobasilar junction (series 12, image 122. Furthermore, there is a small 2 millimeter saccular aneurysm versus atherosclerotic pseudo-lesion of the left V4 segment seen on series 12, image 129. The left AICA appears to be dominant and patent. Patent basilar artery with mild irregularity but no stenosis. Patent SCA and PCA origins. Small posterior communicating arteries. Moderate to severe left PCA P2 segment stenosis on series 16, image 21. Preserved left PCA branch  enhancement. Mild to moderate contralateral right P2 stenosis (series 14, image 20) also with preserved distal enhancement. Anterior circulation: Both ICA siphons are patent. On the left there is moderate to severe calcified plaque and moderate to severe supraclinoid stenosis on series 10, image 219. On the right side there is similar extensive calcified plaque, with moderate to severe cavernous and severe supraclinoid right ICA stenoses (series 10, image 220). Normal ophthalmic and posterior communicating artery origins. Patent carotid termini. MCA and ACA origins are within normal limits. There is bilateral A1 segment irregularity without stenosis. Diminutive anterior communicating artery. Bilateral ACA branches are within normal limits. There is moderate irregularity in the left MCA M1 segment without stenosis. Left MCA bifurcation is patent. Left MCA branches appear patent with mild stenosis. On the right there is moderate to severe irregularity and at least moderate stenosis of the right MCA M1 segment (series 14, images 20 and 21). The right MCA bifurcation is patent but irregular with moderate to severe proximal M2 stenosis on series  15, image 18. However, no right MCA branch occlusion is identified. Venous sinuses: Patent. Anatomic variants: None. Review of the MIP images confirms the above findings IMPRESSION: 1. Negative for large vessel occlusion, but positive for high-grade RADIOGRAPHIC STRING SIGN stenoses due to bulky calcified plaque of: - Left vertebral artery V4 segment (tandem string sign stenoses). - Right Vertebral artery V4 segment. - Right ICA supraclinoid segment. 2. Additionally, there are moderate to severe atherosclerotic stenoses of: - Left ICA supraclinoid segment. - Right ICA cavernous segment. - Right MCA M1 segment and MCA bifurcation. - Left PCA P2 segment. 3. Furthermore, there is a small 2 mm saccular aneurysm versus atherosclerotic pseudo-lesion of the left vertebral V4 segment  (series 12, image 129). 4. No significant Arterial stenosis in the neck despite atherosclerosis. 5.  Stable CT appearance of the brain. Electronically Signed   By: Odessa Fleming M.D.   On: 12/14/2018 01:02   Ct Head Wo Contrast  Result Date: 12/13/2018 CLINICAL DATA:  Dizziness, nausea, vomiting EXAM: CT HEAD WITHOUT CONTRAST TECHNIQUE: Contiguous axial images were obtained from the base of the skull through the vertex without intravenous contrast. COMPARISON:  None. FINDINGS: Brain: There is atrophy and chronic small vessel disease changes. No acute intracranial abnormality. Specifically, no hemorrhage, hydrocephalus, mass lesion, acute infarction, or significant intracranial injury. Vascular: No hyperdense vessel or unexpected calcification. Skull: No acute calvarial abnormality. Sinuses/Orbits: Visualized paranasal sinuses and mastoids clear. Orbital soft tissues unremarkable. Other: None IMPRESSION: Atrophy, chronic microvascular disease. No acute intracranial abnormality. Electronically Signed   By: Charlett Nose M.D.   On: 12/13/2018 19:23   Ct Angio Neck W And/or Wo Contrast  Result Date: 12/14/2018 CLINICAL DATA:  68 year old male with sudden onset dizziness with no acute infarct but heavily calcified ectatic distal vertebral arteries on head CT and MRI earlier today. EXAM: CT ANGIOGRAPHY HEAD AND NECK TECHNIQUE: Multidetector CT imaging of the head and neck was performed using the standard protocol during bolus administration of intravenous contrast. Multiplanar CT image reconstructions and MIPs were obtained to evaluate the vascular anatomy. Carotid stenosis measurements (when applicable) are obtained utilizing NASCET criteria, using the distal internal carotid diameter as the denominator. CONTRAST:  75mL OMNIPAQUE IOHEXOL 350 MG/ML SOLN COMPARISON:  brain MRI and head CT earlier tonight. FINDINGS: CT HEAD Brain: Stable gray-white matter differentiation throughout the brain. No acute intracranial  abnormality. Calvarium and skull base: No acute osseous abnormality identified. Paranasal sinuses: Visualized paranasal sinuses and mastoids are stable and well pneumatized. Orbits: Visualized orbits and scalp soft tissues are within normal limits. CTA NECK Skeleton: Carious left mandible dentition. No acute osseous abnormality identified. Upper chest: Negative visible upper lungs aside from mild atelectasis. Mild mediastinal lipomatosis. No superior mediastinal lymphadenopathy. Other neck: No acute findings. Aortic arch: Calcified aortic atherosclerosis. 3 vessel arch configuration. Right carotid system: Tortuous brachiocephalic artery with mild plaque and no stenosis. Tortuous proximal right carotid artery with a kinked appearance at the thoracic inlet but no other stenosis. Soft and calcified plaque at the right ICA origin and bulb with no associated stenosis. Tortuous right ICA just below the skull base. Left carotid system: Mildly tortuous proximal left CCA without stenosis. Partially retropharyngeal course. Calcified plaque at the left ICA origin resulting in less than 50 % stenosis with respect to the distal vessel. Tortuous left ICA just below the skull base. Vertebral arteries: Tortuous proximal right subclavian artery without stenosis. Normal right vertebral artery origin on series 10, image 537. Patent right vertebral artery to the skull  base with tortuosity but no stenosis. Mild plaque in the proximal left subclavian artery without stenosis. Normal left vertebral artery origin. Tortuous left V1 segment with a kinked appearance but no other stenosis. Patent left vertebral artery to the skull base with mild tortuosity, no stenosis. CTA HEAD Posterior circulation: Heavily calcified bilateral vertebral artery V4 segments with radiographic string sign stenosis as seen on series 10, images 274, 271 and series 12, images 133 and 131. On the right the high-grade stenosis is proximal to the right PICA origin  which remains patent on series 10, image 260. Both vertebral arteries do remain patent to the vertebrobasilar junction, and this despite a 2nd level of tandem string sign stenosis of the distal left V4 segment just proximal to the vertebrobasilar junction (series 12, image 122. Furthermore, there is a small 2 millimeter saccular aneurysm versus atherosclerotic pseudo-lesion of the left V4 segment seen on series 12, image 129. The left AICA appears to be dominant and patent. Patent basilar artery with mild irregularity but no stenosis. Patent SCA and PCA origins. Small posterior communicating arteries. Moderate to severe left PCA P2 segment stenosis on series 16, image 21. Preserved left PCA branch enhancement. Mild to moderate contralateral right P2 stenosis (series 14, image 20) also with preserved distal enhancement. Anterior circulation: Both ICA siphons are patent. On the left there is moderate to severe calcified plaque and moderate to severe supraclinoid stenosis on series 10, image 219. On the right side there is similar extensive calcified plaque, with moderate to severe cavernous and severe supraclinoid right ICA stenoses (series 10, image 220). Normal ophthalmic and posterior communicating artery origins. Patent carotid termini. MCA and ACA origins are within normal limits. There is bilateral A1 segment irregularity without stenosis. Diminutive anterior communicating artery. Bilateral ACA branches are within normal limits. There is moderate irregularity in the left MCA M1 segment without stenosis. Left MCA bifurcation is patent. Left MCA branches appear patent with mild stenosis. On the right there is moderate to severe irregularity and at least moderate stenosis of the right MCA M1 segment (series 14, images 20 and 21). The right MCA bifurcation is patent but irregular with moderate to severe proximal M2 stenosis on series 15, image 18. However, no right MCA branch occlusion is identified. Venous sinuses:  Patent. Anatomic variants: None. Review of the MIP images confirms the above findings IMPRESSION: 1. Negative for large vessel occlusion, but positive for high-grade RADIOGRAPHIC STRING SIGN stenoses due to bulky calcified plaque of: - Left vertebral artery V4 segment (tandem string sign stenoses). - Right Vertebral artery V4 segment. - Right ICA supraclinoid segment. 2. Additionally, there are moderate to severe atherosclerotic stenoses of: - Left ICA supraclinoid segment. - Right ICA cavernous segment. - Right MCA M1 segment and MCA bifurcation. - Left PCA P2 segment. 3. Furthermore, there is a small 2 mm saccular aneurysm versus atherosclerotic pseudo-lesion of the left vertebral V4 segment (series 12, image 129). 4. No significant Arterial stenosis in the neck despite atherosclerosis. 5.  Stable CT appearance of the brain. Electronically Signed   By: Odessa Fleming M.D.   On: 12/14/2018 01:02   Mr Brain Wo Contrast  Result Date: 12/13/2018 CLINICAL DATA:  68 year old male with sudden onset dizziness, nausea vomiting. EXAM: MRI HEAD WITHOUT CONTRAST TECHNIQUE: Multiplanar, multiecho pulse sequences of the brain and surrounding structures were obtained without intravenous contrast. COMPARISON:  Head CT earlier tonight. FINDINGS: Brain: No restricted diffusion to suggest acute infarction. No midline shift, mass effect, evidence of mass  lesion, ventriculomegaly, extra-axial collection or acute intracranial hemorrhage. Cervicomedullary junction and pituitary are within normal limits. Patchy and confluent bilateral cerebral white matter T2 and FLAIR hyperintensity in a nonspecific configuration. No chronic cerebral blood products or cortical encephalomalacia identified. Minimal T2 heterogeneity in the deep gray matter nuclei. Brainstem and cerebellum appear normal. Vascular: Major intracranial vascular flow voids are preserved. Dolichoectatic distal vertebral arteries, heavily calcified on the CT earlier today. Skull  and upper cervical spine: Negative visible cervical spine. Visualized bone marrow signal is within normal limits. Sinuses/Orbits: Negative orbits. Paranasal sinuses are clear. Other: Mastoids are clear. Visible internal auditory structures appear normal. Stylomastoid foramina appear within normal limits. Scalp and face soft tissues appear negative. IMPRESSION: 1. Negative for acute infarct or acute intracranial abnormality. 2. Heavily calcified and dolichoectatic distal vertebral arteries. There is no evidence of a prior posterior circulation infarct, but consider the possibility of vertebrobasilar insufficiency in this clinical setting. 3. Moderate for age nonspecific cerebral white matter signal changes, most commonly due to chronic small vessel disease. Electronically Signed   By: Odessa Fleming M.D.   On: 12/13/2018 20:46    EKG: Independently reviewed. snr ,with q in inferior leads, + LVH  No hyperacute st -twave changes.   Assessment/Plan Active Problems:   TIA (transient ischemic attack) Patient is a 52-year-old male without significant past medical history other than personality disorder/depression who presents to the ED with episodes of refractory vertigo on evaluation he was found to have negative MRI CT head for acute ischemic event however it was noted that his posterior circulation noted significant calcification due to his symptoms telemetry neuro was concerned for possible posterior circulation event not yet seen on imaging.   this patient is being admitted for further evaluation and treatment.  Refractory vertigo with associated right-sided nystagmus  -Possible TIA /CVA -Placed on TIA CVA protocol, echo cardiogram, CTA of the head and neck pending as per neurology recommendation -Start Plavix 75 daily, aspirin 81 daily -Continue with neurochecks per protocol -Start statin atorvastatin -Neurology consultation in the a.m.  Depression/personality disorder -Resume outpatient medications as  able  Mild leukocytosis -no signs of infection , continue to monitor lab  F EN Stable Replete lytes as needed IVFs per protocol   DVT prophylaxis:Code Status: Heparin subcu Family Communication: N/A  Disposition Plan: 48 hours  Consults called: Neurology  Admission status:  Inpatient   Lurline Del MD [facility] Pager [phone]   If 7PM-7AM, please contact night-coverage [email or web address] Password [email or web address]  12/14/2018, 2:25 AM

## 2018-12-14 NOTE — Progress Notes (Signed)
*  PRELIMINARY RESULTS* Echocardiogram 2D Echocardiogram has been performed.  Albert Leon 12/14/2018, 8:55 AM

## 2018-12-14 NOTE — ED Notes (Signed)
Patient's sister Bethena Roys Lobe would like to have the patient's high blood pressure addressed and be called with any information. Patient gave verbal consent to this writer to give his sister information.

## 2018-12-14 NOTE — Progress Notes (Signed)
Brief updated progress note, see same day H&P for billable details This is a non-billable note.  Patient seen and examined.  Remains in ED under the hospitalist service.  No acute status changes.  No new complaints.  Neurology re-evaluated.  Will continue with DAPT for now.  Will reach out to vascular to evaluate for any interventions beyond medical management.  PT and OT consulted, recommend outpatient PT.  Ralene Muskrat MD

## 2018-12-14 NOTE — Evaluation (Signed)
Physical Therapy Evaluation Patient Details Name: Albert Leon MRN: [id number] DOB: [birth year] Today's Date: 12/14/2018   History of Present Illness  pt is a 68 yo male who presented to ED with complaints of dizziness and inability to stand upright, initial head CT negative, MRI negative however significant calcification in the posterior circulation, telemetry neuro noted right sided nystagmus and diplopia. PMH includes personality disorder and depression  Clinical Impression  Pt 68yo male admitted for above. Pt in bed upon arrival and agreeable to PT. Alert and oriented x4. Pt presents with strength grossly 4/5, decreased balance and complaints of dizziness. Pt lives alone and walks everywhere and reports being independent with all ADLs/IADLs. pt reported increased dizziness when sitting up EOB and BP 182/88.  Pt required close min guard for transfers and ambulation due to unsteadiness and decreased balance. Pt verbalized feeling like he was going to fall. Pt provided with RW for STS and ambulation with improved stability and confidence. Pt denied any sensation or vision changes. During vision assessment nystagmus noted. Nystagmus noted during head shaking nystagmus test and head impulse test. Coordination testing with finger to nose and heel to shin negative. A second skilled PT who also holds NCS present for further vestibular testing. Current symptoms seem to be consistent with central vestibular dysfunction. Pt reporting mild dizziness despite increased nystagmus with testing and head turns indicating potential habituation and pt has been experiencing symptoms for longer than he thinks. Pt educated on early stroke signs/symptoms using BEFAST with pt verbalizing understanding. Pt encouraged to perform LE therex on his own throughout the day to maintain LE strength after pt reporting feeling like he had no strength. Pt would benefit from continued skilled acute PT to address potential vestibular  concerns and improve balance to allow for safe return home. Recommendation for outpatient PT for balance training if continues to present with current balance deficits in order to decrease fall risk and to address any vestibular dysfunctions that may be going on.     Follow Up Recommendations Outpatient PT;Other (comment)(balance training and potentially vestibular dysfunction)    Equipment Recommendations  Rolling walker with 5" wheels    Recommendations for Other Services       Precautions / Restrictions Precautions Precautions: Fall Restrictions Weight Bearing Restrictions: No      Mobility  Bed Mobility Overal bed mobility: Independent             General bed mobility comments: no assist needed to get EOB  Transfers Overall transfer level: Needs assistance Equipment used: Rolling walker (2 wheeled);None Transfers: Sit to/from Stand Sit to Stand: Min guard         General transfer comment: min guard for safety with STS without AD, pt mildly unsteady however reporting he felt like he was going to fall during transfer and reporting increased dizziness, pt performed a second STS with RW with improved stability and confidence  Ambulation/Gait Ambulation/Gait assistance: Min guard Gait Distance (Feet): 5 Feet Assistive device: Rolling walker (2 wheeled);1 person hand held assist Gait Pattern/deviations: WFL(Within Functional Limits) Gait velocity: decreased   General Gait Details: pt ambulated a few feet without AD needing handheld A from therapist and reaching out for wall, pt unsteady and reporting feeling like he was going to fall, when provided with RW pt much more stable and confident during gait  Stairs            Wheelchair Mobility    Modified Rankin (Stroke Patients Only)  Balance Overall balance assessment: Needs assistance Sitting-balance support: No upper extremity supported Sitting balance-Leahy Scale: Good     Standing balance  support: No upper extremity supported Standing balance-Leahy Scale: Fair                 High Level Balance Comments: standing with narrow BOS noted increased sway and required very close guarding, unable to maintain balance in semi tandem             Pertinent Vitals/Pain Pain Assessment: Faces Faces Pain Scale: Hurts a little bit Pain Location: back of head Pain Descriptors / Indicators: Headache Pain Intervention(s): Monitored during session    Home Living Family/patient expects to be discharged to:: Private residence Living Arrangements: Alone Available Help at Discharge: Friend(s);Family;Available PRN/intermittently Type of Home: Apartment Home Access: Level entry     Home Layout: One level Home Equipment: Grab bars - tub/shower      Prior Function Level of Independence: Independent         Comments: pt reports being independent in all ADLs/IADLs, working in [employer] although lost job with covid, pt reports that he walks everywhere vs driving     Hand Dominance        Extremity/Trunk Assessment   Upper Extremity Assessment Upper Extremity Assessment: Defer to OT evaluation;Overall WFL for tasks assessed    Lower Extremity Assessment Lower Extremity Assessment: Overall WFL for tasks assessed(B LE strength grossly 4/5, no significant differences between R and L)       Communication   Communication: No difficulties  Cognition Arousal/Alertness: Awake/alert Behavior During Therapy: WFL for tasks assessed/performed Overall Cognitive Status: Within Functional Limits for tasks assessed                                 General Comments: alert and oriented x4      General Comments      Exercises Total Joint Exercises Long Arc Quad: AROM;Both;15 reps Marching in Standing: AROM;Both;15 reps;Seated General Exercises - Lower Extremity Heel Raises: AROM;Both;15 reps;Seated   Assessment/Plan    PT Assessment Patient  needs continued PT services  PT Problem List Decreased balance;Decreased knowledge of use of DME;Other (comment)(Dizziness)       PT Treatment Interventions DME instruction;Therapeutic exercise;Gait training;Balance training;Neuromuscular re-education;Functional mobility training;Therapeutic activities;Patient/family education    PT Goals (Current goals can be found in the Care Plan section)  Acute Rehab PT Goals Patient Stated Goal: get better PT Goal Formulation: With patient Time For Goal Achievement: 12/28/18 Potential to Achieve Goals: Good    Frequency Min 2X/week   Barriers to discharge Decreased caregiver support      Co-evaluation               AM-PAC PT "6 Clicks" Mobility  Outcome Measure Help needed turning from your back to your side while in a flat bed without using bedrails?: None   Help needed moving to and from a bed to a chair (including a wheelchair)?: A Little Help needed standing up from a chair using your arms (e.g., wheelchair or bedside chair)?: A Little Help needed to walk in hospital room?: A Little Help needed climbing 3-5 steps with a railing? : A Little 6 Click Score: 16    End of Session Equipment Utilized During Treatment: Gait belt Activity Tolerance: Patient tolerated treatment well Patient left: in bed;with call bell/phone within reach Nurse Communication: Mobility status PT Visit Diagnosis: Other symptoms and  signs involving the nervous system (R29.898);Dizziness and giddiness (R42);Difficulty in walking, not elsewhere classified (R26.2)    Time: [id number] PT Time Calculation (min) (ACUTE ONLY): 49 min   Charges:   PT Evaluation $PT Eval Moderate Complexity: 1 Mod PT Treatments $Therapeutic Exercise: 8-22 mins $Self Care/Home Management: 8-22        Karoline CaldwellAmy Sofija Antwi PT, DPT 1:18 PM,12/14/18 [phone]   Omolola Mittman Shyrl NumbersM Twan Harkin 12/14/2018, 1:05 PM

## 2018-12-14 NOTE — ED Notes (Signed)
Admitting informed via secure chat that pt passed SSS, requested diet order

## 2018-12-14 NOTE — ED Notes (Signed)
Pt ate all of breakfast. Physical therapy at bedside to assess.

## 2018-12-14 NOTE — Consult Note (Signed)
Reason for Consult:Dizziness Referring Physician: Georgeann Oppenheim  CC: Dizziness  HPI: Albert Leon is an 68 y.o. male with a history of personality disorder and depression who presented with acute onset of dizziness.  Patient reports that he was at baseline until 1430 on 11/30.  At that time patient was on his way home when he acutely became very dizzy stated that he was unable to stand upright and had to immediately sit.  He states that symptoms continued to persist and was associated with nausea and vomiting.  He states a passerby assisted him in calling EMS.  Patient was seen by teleneuro and not felt to be a tPA candidate but was admitted for further evaluation.   Today reports improvement in his symptoms with no further nausea and vomiting and only some mild dizziness.  Dizziness is positional.    History reviewed. No pertinent past medical history.  History reviewed. No pertinent surgical history.  Family History  Problem Relation Age of Onset  . Hypertension Mother   . Hypertension Father     Social History:  reports that he has never smoked. He has never used smokeless tobacco. He reports that he does not drink alcohol or use drugs.  No Known Allergies  Medications:  I have reviewed the patient's current medications. Prior to Admission:  Prior to Admission medications   Medication Sig Start Date End Date Taking? Authorizing Provider  naproxen (NAPROSYN) 500 MG tablet Take 1 tablet (500 mg total) by mouth 2 (two) times daily as needed for moderate pain. Patient not taking: Reported on 12/14/2018 01/22/17   Tommie Sams, DO     Scheduled: .  stroke: mapping our early stages of recovery book   Does not apply Once  . aspirin EC  81 mg Oral Daily  . clopidogrel  75 mg Oral Daily  . heparin  5,000 Units Subcutaneous Q8H    ROS: History obtained from the patient  General ROS: negative for - chills, fatigue, fever, night sweats, weight gain or weight loss Psychological ROS:  negative for - behavioral disorder, hallucinations, memory difficulties, mood swings or suicidal ideation Ophthalmic ROS: negative for - blurry vision, double vision, eye pain or loss of vision ENT ROS: dizziness Allergy and Immunology ROS: negative for - hives or itchy/watery eyes Hematological and Lymphatic ROS: negative for - bleeding problems, bruising or swollen lymph nodes Endocrine ROS: negative for - galactorrhea, hair pattern changes, polydipsia/polyuria or temperature intolerance Respiratory ROS: negative for - cough, hemoptysis, shortness of breath or wheezing Cardiovascular ROS: negative for - chest pain, dyspnea on exertion, edema or irregular heartbeat Gastrointestinal ROS: nausea/vomiting Genito-Urinary ROS: negative for - dysuria, hematuria, incontinence or urinary frequency/urgency Musculoskeletal ROS: negative for - joint swelling or muscular weakness Neurological ROS: as noted in HPI Dermatological ROS: negative for rash and skin lesion changes  Physical Examination: Blood pressure (!) 159/79, pulse 78, temperature 98.3 F (36.8 C), temperature source Oral, resp. rate 18, height  (1.727 m), weight 74.8 kg, SpO2 94 %.  HEENT-  Normocephalic, no lesions, without obvious abnormality.  Normal external eye and conjunctiva.  Normal TM's bilaterally.  Normal auditory canals and external ears. Normal external nose, mucus membranes and septum.  Normal pharynx. Cardiovascular- S1, S2 normal, pulses palpable throughout   Lungs- chest clear, no wheezing, rales, normal symmetric air entry Abdomen- soft, non-tender; bowel sounds normal; no masses,  no organomegaly Extremities- no edema Lymph-no adenopathy palpable Musculoskeletal-no joint tenderness, deformity or swelling Skin-warm and dry, no hyperpigmentation, vitiligo,  or suspicious lesions  Neurological Examination   Mental Status: Alert, oriented, thought content appropriate.  Speech fluent without evidence of aphasia.   Able to follow 3 step commands without difficulty. Cranial Nerves: II: Visual fields grossly normal, pupils equal, round, reactive to light and accommodation III,IV, VI: ptosis not present, extra-ocular motions intact with nystagmus noted on all directions, most prominent of right lateral gaze V,VII: smile symmetric, facial light touch sensation normal bilaterally VIII: hearing normal bilaterally IX,X: gag reflex present XI: bilateral shoulder shrug XII: midline tongue extension Motor: Right : Upper extremity   5/5    Left:     Upper extremity   5/5  Lower extremity   5/5     Lower extremity   5/5 Tone and bulk:normal tone throughout; no atrophy noted Sensory: Pinprick and light touch intact throughout, bilaterally Deep Tendon Reflexes: Symmetric throughout Plantars: Right: downgoing   Left: downgoing Cerebellar: Normal finger-to-nose and normal heel-to-shin testing bilaterally Gait: not tested due to safety concerns    Laboratory Studies:   Basic Metabolic Panel: Recent Labs  Lab 12/13/18 1647 12/14/18 0643  NA 141  --   K 3.7  --   CL 106  --   CO2 23  --   GLUCOSE 145*  --   BUN 25*  --   CREATININE 1.03 0.71  CALCIUM 9.7  --     Liver Function Tests: Recent Labs  Lab 12/13/18 1647  AST 27  ALT 24  ALKPHOS 71  BILITOT 0.6  PROT 8.3*  ALBUMIN 4.7   No results for input(s): LIPASE, AMYLASE in the last 168 hours. No results for input(s): AMMONIA in the last 168 hours.  CBC: Recent Labs  Lab 12/13/18 1647 12/14/18 0643  WBC 11.0* 8.8  HGB 15.5 14.0  HCT 44.3 40.5  MCV 87.7 87.7  PLT 304 279    Cardiac Enzymes: No results for input(s): CKTOTAL, CKMB, CKMBINDEX, TROPONINI in the last 168 hours.  BNP: Invalid input(s): POCBNP  CBG: No results for input(s): GLUCAP in the last 168 hours.  Microbiology: No results found for this or any previous visit.  Coagulation Studies: No results for input(s): LABPROT, INR in the last 72 hours.  Urinalysis:   Recent Labs  Lab 12/13/18 2341  COLORURINE YELLOW*  LABSPEC 1.018  PHURINE 6.0  GLUCOSEU NEGATIVE  HGBUR SMALL*  BILIRUBINUR NEGATIVE  KETONESUR 20*  PROTEINUR NEGATIVE  NITRITE NEGATIVE  LEUKOCYTESUR NEGATIVE    Lipid Panel:     Component Value Date/Time   CHOL 227 (H) 12/14/2018 0643   TRIG 75 12/14/2018 0643   HDL 45 12/14/2018 0643   CHOLHDL 5.0 12/14/2018 0643   VLDL 15 12/14/2018 0643   LDLCALC 167 (H) 12/14/2018 0643    HgbA1C:  Lab Results  Component Value Date   HGBA1C 5.2 12/14/2018    Urine Drug Screen:      Component Value Date/Time   LABOPIA NONE DETECTED 12/13/2018 2341   COCAINSCRNUR NONE DETECTED 12/13/2018 2341   LABBENZ NONE DETECTED 12/13/2018 2341   AMPHETMU NONE DETECTED 12/13/2018 2341   THCU NONE DETECTED 12/13/2018 2341   LABBARB NONE DETECTED 12/13/2018 2341    Alcohol Level: No results for input(s): ETH in the last 168 hours.  Other results: EKG: normal sinus rhythm at 61 bpm.  Imaging: Ct Angio Head W Or Wo Contrast  Result Date: 12/14/2018 CLINICAL DATA:  68 year old male with sudden onset dizziness with no acute infarct but heavily calcified ectatic distal vertebral arteries on head  CT and MRI earlier today. EXAM: CT ANGIOGRAPHY HEAD AND NECK TECHNIQUE: Multidetector CT imaging of the head and neck was performed using the standard protocol during bolus administration of intravenous contrast. Multiplanar CT image reconstructions and MIPs were obtained to evaluate the vascular anatomy. Carotid stenosis measurements (when applicable) are obtained utilizing NASCET criteria, using the distal internal carotid diameter as the denominator. CONTRAST:  75mL OMNIPAQUE IOHEXOL 350 MG/ML SOLN COMPARISON:  brain MRI and head CT earlier tonight. FINDINGS: CT HEAD Brain: Stable gray-white matter differentiation throughout the brain. No acute intracranial abnormality. Calvarium and skull base: No acute osseous abnormality identified. Paranasal sinuses:  Visualized paranasal sinuses and mastoids are stable and well pneumatized. Orbits: Visualized orbits and scalp soft tissues are within normal limits. CTA NECK Skeleton: Carious left mandible dentition. No acute osseous abnormality identified. Upper chest: Negative visible upper lungs aside from mild atelectasis. Mild mediastinal lipomatosis. No superior mediastinal lymphadenopathy. Other neck: No acute findings. Aortic arch: Calcified aortic atherosclerosis. 3 vessel arch configuration. Right carotid system: Tortuous brachiocephalic artery with mild plaque and no stenosis. Tortuous proximal right carotid artery with a kinked appearance at the thoracic inlet but no other stenosis. Soft and calcified plaque at the right ICA origin and bulb with no associated stenosis. Tortuous right ICA just below the skull base. Left carotid system: Mildly tortuous proximal left CCA without stenosis. Partially retropharyngeal course. Calcified plaque at the left ICA origin resulting in less than 50 % stenosis with respect to the distal vessel. Tortuous left ICA just below the skull base. Vertebral arteries: Tortuous proximal right subclavian artery without stenosis. Normal right vertebral artery origin on series 10, image 537. Patent right vertebral artery to the skull base with tortuosity but no stenosis. Mild plaque in the proximal left subclavian artery without stenosis. Normal left vertebral artery origin. Tortuous left V1 segment with a kinked appearance but no other stenosis. Patent left vertebral artery to the skull base with mild tortuosity, no stenosis. CTA HEAD Posterior circulation: Heavily calcified bilateral vertebral artery V4 segments with radiographic string sign stenosis as seen on series 10, images 274, 271 and series 12, images 133 and 131. On the right the high-grade stenosis is proximal to the right PICA origin which remains patent on series 10, image 260. Both vertebral arteries do remain patent to the  vertebrobasilar junction, and this despite a 2nd level of tandem string sign stenosis of the distal left V4 segment just proximal to the vertebrobasilar junction (series 12, image 122. Furthermore, there is a small 2 millimeter saccular aneurysm versus atherosclerotic pseudo-lesion of the left V4 segment seen on series 12, image 129. The left AICA appears to be dominant and patent. Patent basilar artery with mild irregularity but no stenosis. Patent SCA and PCA origins. Small posterior communicating arteries. Moderate to severe left PCA P2 segment stenosis on series 16, image 21. Preserved left PCA branch enhancement. Mild to moderate contralateral right P2 stenosis (series 14, image 20) also with preserved distal enhancement. Anterior circulation: Both ICA siphons are patent. On the left there is moderate to severe calcified plaque and moderate to severe supraclinoid stenosis on series 10, image 219. On the right side there is similar extensive calcified plaque, with moderate to severe cavernous and severe supraclinoid right ICA stenoses (series 10, image 220). Normal ophthalmic and posterior communicating artery origins. Patent carotid termini. MCA and ACA origins are within normal limits. There is bilateral A1 segment irregularity without stenosis. Diminutive anterior communicating artery. Bilateral ACA branches are within normal  limits. There is moderate irregularity in the left MCA M1 segment without stenosis. Left MCA bifurcation is patent. Left MCA branches appear patent with mild stenosis. On the right there is moderate to severe irregularity and at least moderate stenosis of the right MCA M1 segment (series 14, images 20 and 21). The right MCA bifurcation is patent but irregular with moderate to severe proximal M2 stenosis on series 15, image 18. However, no right MCA branch occlusion is identified. Venous sinuses: Patent. Anatomic variants: None. Review of the MIP images confirms the above findings  IMPRESSION: 1. Negative for large vessel occlusion, but positive for high-grade RADIOGRAPHIC STRING SIGN stenoses due to bulky calcified plaque of: - Left vertebral artery V4 segment (tandem string sign stenoses). - Right Vertebral artery V4 segment. - Right ICA supraclinoid segment. 2. Additionally, there are moderate to severe atherosclerotic stenoses of: - Left ICA supraclinoid segment. - Right ICA cavernous segment. - Right MCA M1 segment and MCA bifurcation. - Left PCA P2 segment. 3. Furthermore, there is a small 2 mm saccular aneurysm versus atherosclerotic pseudo-lesion of the left vertebral V4 segment (series 12, image 129). 4. No significant Arterial stenosis in the neck despite atherosclerosis. 5.  Stable CT appearance of the brain. Electronically Signed   By: Odessa FlemingH  Hall M.D.   On: 12/14/2018 01:02   Ct Head Wo Contrast  Result Date: 12/13/2018 CLINICAL DATA:  Dizziness, nausea, vomiting EXAM: CT HEAD WITHOUT CONTRAST TECHNIQUE: Contiguous axial images were obtained from the base of the skull through the vertex without intravenous contrast. COMPARISON:  None. FINDINGS: Brain: There is atrophy and chronic small vessel disease changes. No acute intracranial abnormality. Specifically, no hemorrhage, hydrocephalus, mass lesion, acute infarction, or significant intracranial injury. Vascular: No hyperdense vessel or unexpected calcification. Skull: No acute calvarial abnormality. Sinuses/Orbits: Visualized paranasal sinuses and mastoids clear. Orbital soft tissues unremarkable. Other: None IMPRESSION: Atrophy, chronic microvascular disease. No acute intracranial abnormality. Electronically Signed   By: Charlett NoseKevin  Dover M.D.   On: 12/13/2018 19:23   Ct Angio Neck W And/or Wo Contrast  Result Date: 12/14/2018 CLINICAL DATA:  68 year old male with sudden onset dizziness with no acute infarct but heavily calcified ectatic distal vertebral arteries on head CT and MRI earlier today. EXAM: CT ANGIOGRAPHY HEAD AND  NECK TECHNIQUE: Multidetector CT imaging of the head and neck was performed using the standard protocol during bolus administration of intravenous contrast. Multiplanar CT image reconstructions and MIPs were obtained to evaluate the vascular anatomy. Carotid stenosis measurements (when applicable) are obtained utilizing NASCET criteria, using the distal internal carotid diameter as the denominator. CONTRAST:  75mL OMNIPAQUE IOHEXOL 350 MG/ML SOLN COMPARISON:  brain MRI and head CT earlier tonight. FINDINGS: CT HEAD Brain: Stable gray-white matter differentiation throughout the brain. No acute intracranial abnormality. Calvarium and skull base: No acute osseous abnormality identified. Paranasal sinuses: Visualized paranasal sinuses and mastoids are stable and well pneumatized. Orbits: Visualized orbits and scalp soft tissues are within normal limits. CTA NECK Skeleton: Carious left mandible dentition. No acute osseous abnormality identified. Upper chest: Negative visible upper lungs aside from mild atelectasis. Mild mediastinal lipomatosis. No superior mediastinal lymphadenopathy. Other neck: No acute findings. Aortic arch: Calcified aortic atherosclerosis. 3 vessel arch configuration. Right carotid system: Tortuous brachiocephalic artery with mild plaque and no stenosis. Tortuous proximal right carotid artery with a kinked appearance at the thoracic inlet but no other stenosis. Soft and calcified plaque at the right ICA origin and bulb with no associated stenosis. Tortuous right ICA just below the  skull base. Left carotid system: Mildly tortuous proximal left CCA without stenosis. Partially retropharyngeal course. Calcified plaque at the left ICA origin resulting in less than 50 % stenosis with respect to the distal vessel. Tortuous left ICA just below the skull base. Vertebral arteries: Tortuous proximal right subclavian artery without stenosis. Normal right vertebral artery origin on series 10, image 537. Patent  right vertebral artery to the skull base with tortuosity but no stenosis. Mild plaque in the proximal left subclavian artery without stenosis. Normal left vertebral artery origin. Tortuous left V1 segment with a kinked appearance but no other stenosis. Patent left vertebral artery to the skull base with mild tortuosity, no stenosis. CTA HEAD Posterior circulation: Heavily calcified bilateral vertebral artery V4 segments with radiographic string sign stenosis as seen on series 10, images 274, 271 and series 12, images 133 and 131. On the right the high-grade stenosis is proximal to the right PICA origin which remains patent on series 10, image 260. Both vertebral arteries do remain patent to the vertebrobasilar junction, and this despite a 2nd level of tandem string sign stenosis of the distal left V4 segment just proximal to the vertebrobasilar junction (series 12, image 122. Furthermore, there is a small 2 millimeter saccular aneurysm versus atherosclerotic pseudo-lesion of the left V4 segment seen on series 12, image 129. The left AICA appears to be dominant and patent. Patent basilar artery with mild irregularity but no stenosis. Patent SCA and PCA origins. Small posterior communicating arteries. Moderate to severe left PCA P2 segment stenosis on series 16, image 21. Preserved left PCA branch enhancement. Mild to moderate contralateral right P2 stenosis (series 14, image 20) also with preserved distal enhancement. Anterior circulation: Both ICA siphons are patent. On the left there is moderate to severe calcified plaque and moderate to severe supraclinoid stenosis on series 10, image 219. On the right side there is similar extensive calcified plaque, with moderate to severe cavernous and severe supraclinoid right ICA stenoses (series 10, image 220). Normal ophthalmic and posterior communicating artery origins. Patent carotid termini. MCA and ACA origins are within normal limits. There is bilateral A1 segment  irregularity without stenosis. Diminutive anterior communicating artery. Bilateral ACA branches are within normal limits. There is moderate irregularity in the left MCA M1 segment without stenosis. Left MCA bifurcation is patent. Left MCA branches appear patent with mild stenosis. On the right there is moderate to severe irregularity and at least moderate stenosis of the right MCA M1 segment (series 14, images 20 and 21). The right MCA bifurcation is patent but irregular with moderate to severe proximal M2 stenosis on series 15, image 18. However, no right MCA branch occlusion is identified. Venous sinuses: Patent. Anatomic variants: None. Review of the MIP images confirms the above findings IMPRESSION: 1. Negative for large vessel occlusion, but positive for high-grade RADIOGRAPHIC STRING SIGN stenoses due to bulky calcified plaque of: - Left vertebral artery V4 segment (tandem string sign stenoses). - Right Vertebral artery V4 segment. - Right ICA supraclinoid segment. 2. Additionally, there are moderate to severe atherosclerotic stenoses of: - Left ICA supraclinoid segment. - Right ICA cavernous segment. - Right MCA M1 segment and MCA bifurcation. - Left PCA P2 segment. 3. Furthermore, there is a small 2 mm saccular aneurysm versus atherosclerotic pseudo-lesion of the left vertebral V4 segment (series 12, image 129). 4. No significant Arterial stenosis in the neck despite atherosclerosis. 5.  Stable CT appearance of the brain. Electronically Signed   By: Althea Grimmer.D.  On: 12/14/2018 01:02   Mr Brain Wo Contrast  Result Date: 12/13/2018 CLINICAL DATA:  68 year old male with sudden onset dizziness, nausea vomiting. EXAM: MRI HEAD WITHOUT CONTRAST TECHNIQUE: Multiplanar, multiecho pulse sequences of the brain and surrounding structures were obtained without intravenous contrast. COMPARISON:  Head CT earlier tonight. FINDINGS: Brain: No restricted diffusion to suggest acute infarction. No midline shift, mass  effect, evidence of mass lesion, ventriculomegaly, extra-axial collection or acute intracranial hemorrhage. Cervicomedullary junction and pituitary are within normal limits. Patchy and confluent bilateral cerebral white matter T2 and FLAIR hyperintensity in a nonspecific configuration. No chronic cerebral blood products or cortical encephalomalacia identified. Minimal T2 heterogeneity in the deep gray matter nuclei. Brainstem and cerebellum appear normal. Vascular: Major intracranial vascular flow voids are preserved. Dolichoectatic distal vertebral arteries, heavily calcified on the CT earlier today. Skull and upper cervical spine: Negative visible cervical spine. Visualized bone marrow signal is within normal limits. Sinuses/Orbits: Negative orbits. Paranasal sinuses are clear. Other: Mastoids are clear. Visible internal auditory structures appear normal. Stylomastoid foramina appear within normal limits. Scalp and face soft tissues appear negative. IMPRESSION: 1. Negative for acute infarct or acute intracranial abnormality. 2. Heavily calcified and dolichoectatic distal vertebral arteries. There is no evidence of a prior posterior circulation infarct, but consider the possibility of vertebrobasilar insufficiency in this clinical setting. 3. Moderate for age nonspecific cerebral white matter signal changes, most commonly due to chronic small vessel disease. Electronically Signed   By: Odessa Fleming M.D.   On: 12/13/2018 20:46     Assessment/Plan: 68 y.o. male with a history of personality disorder and depression who presented with acute onset of dizziness.  Neurological examination is significant for nystagmus.  No diplopia complaints.  MRI of the brain reviewed and shows no acute changes.  Chronic small vessel ischemic changes noted.  CTA of the head and neck shows evidence of high grade stenosis of the bilateral V4 segments and right supraclinoid ICA.  Moderate to severe stenosis noted of the left supraclinoid  ICA, right cavernous ICA, right M1 and left P2.  No large vessel occlusion noted.   Patient appeared to experience some improvement from Meclizine.  Recommendations: 1. Agree with DUAP of ASA 81mg  and Plavix 75mg  daily 2. Although areas of stenosis small vessel in nature would have vascular evaluate to determine if they have anything beyond medical management to offer the patient.   3. LDL 167.  Recommend statin for lipid management with target LDL<70. 4. Orthostatic vitals 5. Follow up with neurology on an outpatient basis.    , MD Neurology [phone] 12/14/2018, 10:31 AM

## 2018-12-14 NOTE — ED Notes (Signed)
Patient to be transported to CT and then CPOD

## 2018-12-15 ENCOUNTER — Other Ambulatory Visit: Payer: Self-pay

## 2018-12-15 DIAGNOSIS — I6503 Occlusion and stenosis of bilateral vertebral arteries: Secondary | ICD-10-CM

## 2018-12-15 DIAGNOSIS — R42 Dizziness and giddiness: Secondary | ICD-10-CM

## 2018-12-15 DIAGNOSIS — G450 Vertebro-basilar artery syndrome: Secondary | ICD-10-CM

## 2018-12-15 DIAGNOSIS — G459 Transient cerebral ischemic attack, unspecified: Principal | ICD-10-CM

## 2018-12-15 MED ORDER — AMLODIPINE BESYLATE 5 MG PO TABS
5.0000 mg | ORAL_TABLET | Freq: Once | ORAL | Status: AC
  Administered 2018-12-15: 13:00:00 5 mg via ORAL
  Filled 2018-12-15: qty 1

## 2018-12-15 MED ORDER — AMLODIPINE BESYLATE 5 MG PO TABS
5.0000 mg | ORAL_TABLET | Freq: Every day | ORAL | Status: DC
  Administered 2018-12-15: 5 mg via ORAL
  Filled 2018-12-15: qty 1

## 2018-12-15 MED ORDER — HYDRALAZINE HCL 10 MG PO TABS
10.0000 mg | ORAL_TABLET | Freq: Four times a day (QID) | ORAL | Status: DC | PRN
  Administered 2018-12-15: 10 mg via ORAL
  Filled 2018-12-15 (×3): qty 1

## 2018-12-15 MED ORDER — AMLODIPINE BESYLATE 10 MG PO TABS
10.0000 mg | ORAL_TABLET | Freq: Every day | ORAL | Status: DC
  Administered 2018-12-16: 09:00:00 10 mg via ORAL
  Filled 2018-12-15: qty 1

## 2018-12-15 MED ORDER — LABETALOL HCL 100 MG PO TABS
100.0000 mg | ORAL_TABLET | Freq: Two times a day (BID) | ORAL | Status: DC
  Administered 2018-12-15 – 2018-12-16 (×3): 100 mg via ORAL
  Filled 2018-12-15 (×4): qty 1

## 2018-12-15 MED ORDER — ROSUVASTATIN CALCIUM 10 MG PO TABS
40.0000 mg | ORAL_TABLET | Freq: Every day | ORAL | Status: DC
  Administered 2018-12-15: 22:00:00 40 mg via ORAL
  Filled 2018-12-15: qty 4

## 2018-12-15 NOTE — Progress Notes (Signed)
Physical Therapy Treatment Patient Details Name: Albert Leon MRN: [id number] DOB: 1950/07/16 Today's Date: 12/15/2018    History of Present Illness pt is a 68 yo male who presented to ED with complaints of dizziness and inability to stand upright, initial head CT negative, MRI negative however significant calcification in the posterior circulation, telemetry neuro noted right sided nystagmus and diplopia. PMH includes personality disorder and depression    PT Comments    Pt semi-recumbant in bed and reporting mild improvement in symptoms today.  He required education concerning the benefit of therapy and repeated that he felt his "legs are just tired because of being in bed for three days".  Pt performed bed mobility independently and stood from bedside with supervision.  He did require intermittent UE assist for most standing activity but was able to ambulate 80 ft with mild deviations, no LOB's.  Pt able to perform narrow stance and EC balance training with CGA and self correction of all LOB's.  He reported feeling "dizzy" and stated that it was "back here" gesturing to his occipital area. Pt able to follow education, appeared generally unaware of deficits but did comply with therapeutic treatment.  Pt will continue to benefit from skilled PT with focus on balance and safe functional mobility.  Pt may benefit from a [facility] for safe mobility.   Follow Up Recommendations  Outpatient PT;Other (comment)(balance training and potentially vestibular dysfunction)     Equipment Recommendations  Cane    Recommendations for Other Services       Precautions / Restrictions Precautions Precautions: Fall Restrictions Weight Bearing Restrictions: No    Mobility  Bed Mobility Overal bed mobility: Independent                Transfers Overall transfer level: Needs assistance Equipment used: None Transfers: Sit to/from Stand Sit to Stand: Supervision         General transfer  comment: No report of change in dizziness when standing.  Pt repeated that he just felt like "it is in the back of my head", placing his hand on his head.  Ambulation/Gait Ambulation/Gait assistance: Min guard Gait Distance (Feet): 80 Feet Assistive device: 1 person hand held assist     Gait velocity interpretation: 1.31 - 2.62 ft/sec, indicative of limited community ambulator General Gait Details: Pt appeared to deviate laterally 2-3x during ambulation but was comfortable holding to furniture, therapist of rails on wall intermittently to steady himself.  Pt may benefit from a SPC if this continues.  He repeated that he felt his "legs are just tired" from being in bed for three days.   Stairs             Wheelchair Mobility    Modified Rankin (Stroke Patients Only)       Balance Overall balance assessment: Needs assistance Sitting-balance support: No upper extremity supported Sitting balance-Leahy Scale: Good     Standing balance support: Single extremity supported Standing balance-Leahy Scale: Fair Standing balance comment: Intermittent need for unilateral UE support. Single Leg Stance - Right Leg: 4 Single Leg Stance - Left Leg: 4 Tandem Stance - Right Leg: 5 Tandem Stance - Left Leg: 6 Rhomberg - Eyes Opened: 10(increased a-p sway.) Rhomberg - Eyes Closed: 10(further increased a-p sway with pt needing encouragement to keep hands off of counter.)                Cognition Arousal/Alertness: Awake/alert Behavior During Therapy: WFL for tasks assessed/performed Overall Cognitive Status: Within Functional Limits  for tasks assessed                                 General Comments: A&O x4      Exercises Other Exercises Other Exercises: Feet together 2x10 sec, feet together EC: 2x10 sec, tandem: L: 2x10 sec with 3 LOB's requiring UE support, R: 2x10 sec with 2 LOB's requiring UE support to correct, tandem walk x4 laps along counter with UE support  x4 Other Exercises: Ther ex: standing heel raises x20, CGA with UE support, toe raises x20 with UE support, standing march with prolonged march x20, CGA with UE support. Other Exercises: Education: OP therapy progression and benefit of seated heel/toe raises related to balance when pt can't do standing exercises. Other Exercises: Reaching outside BOS in all directions, including trunk rotation x2 min. Close supervision.    General Comments        Pertinent Vitals/Pain Pain Assessment: No/denies pain    Home Living                      Prior Function            PT Goals (current goals can now be found in the care plan section) Acute Rehab PT Goals Patient Stated Goal: get better PT Goal Formulation: With patient Time For Goal Achievement: 12/28/18 Potential to Achieve Goals: Good Progress towards PT goals: Progressing toward goals    Frequency    Min 2X/week      PT Plan Current plan remains appropriate    Co-evaluation              AM-PAC PT "6 Clicks" Mobility   Outcome Measure  Help needed turning from your back to your side while in a flat bed without using bedrails?: None   Help needed moving to and from a bed to a chair (including a wheelchair)?: A Little Help needed standing up from a chair using your arms (e.g., wheelchair or bedside chair)?: A Little Help needed to walk in hospital room?: A Little Help needed climbing 3-5 steps with a railing? : A Little 6 Click Score: 16    End of Session Equipment Utilized During Treatment: Gait belt Activity Tolerance: Patient tolerated treatment well Patient left: in bed;with call bell/phone within reach Nurse Communication: Mobility status PT Visit Diagnosis: Other symptoms and signs involving the nervous system (R29.898);Dizziness and giddiness (R42);Difficulty in walking, not elsewhere classified (R26.2)     Time: 1537-1600 PT Time Calculation (min) (ACUTE ONLY): 23 min  Charges:  $Therapeutic  Exercise: 23-37 mins                     Glenetta Hew, PT, DPT    Glenetta Hew 12/15/2018, 4:23 PM

## 2018-12-15 NOTE — Consult Note (Signed)
[facility] Vascular Consult Note  MRN : [id number]  Albert Leon is a 68 y.o. (December 21, 1950) male who presents with chief complaint of  Chief Complaint  Patient presents with  . Dizziness   History of Present Illness:  The patient is a 68 year old male with personality disorder, depression and autism who presented to the [facility] emergency department on December 13, 2018 with a chief complaint of "dizziness".  Endorses a history of acute "dizziness" associated with nausea which progressively worsened which prompted him to seek medical attention.  Also noted "seeing double".  Patient was in normal state of health before symptoms began.  Patient endorses a history of walking and acutely becoming dizzy and nauseous.  His symptoms are so severe he was unable to walk and had to sit down. Denies any other neurological deficits.  Denies any fever or continued nausea or vomiting.  Improvement in dizziness today.  CTA Head / Neck (12/14/18): 1. Negative for large vessel occlusion, but positive for high-grade RADIOGRAPHIC STRING SIGN stenoses due to bulky calcified plaque of: - Left vertebral artery V4 segment (tandem string sign stenoses). - Right Vertebral artery V4 segment. - Right ICA supraclinoid segment. 2. Additionally, there are moderate to severe atherosclerotic stenoses of: - Left ICA supraclinoid segment. - Right ICA cavernous segment. - Right MCA M1 segment and MCA bifurcation. - Left PCA P2 segment. 3. Furthermore, there is a small 2 mm saccular aneurysm versus atherosclerotic pseudo-lesion of the left vertebral V4 segment (series 12, image 129). 4. No significant arterial stenosis in the neck despite atherosclerosis. 5.  Stable CT appearance of the brain.  Vascular surgery was consulted by Dr. Georgeann Oppenheim in regard to possible surgical intervention.   Current Facility-Administered Medications  Medication Dose Route Frequency  Provider Last Rate Last Dose  .  stroke: mapping our early stages of recovery book   Does not apply Once Lurline Del, MD      . acetaminophen (TYLENOL) tablet 650 mg  650 mg Oral Q4H PRN Lurline Del, MD       Or  . acetaminophen (TYLENOL) 160 MG/5ML solution 650 mg  650 mg Per Tube Q4H PRN Lurline Del, MD       Or  . acetaminophen (TYLENOL) suppository 650 mg  650 mg Rectal Q4H PRN Lurline Del, MD      . Melene Muller ON 12/16/2018] amLODipine (NORVASC) tablet 10 mg  10 mg Oral Daily Sreenath, Sudheer B, MD      . aspirin EC tablet 81 mg  81 mg Oral Daily Skip Mayer A, MD   81 mg at 12/15/18 0929  . clopidogrel (PLAVIX) tablet 75 mg  75 mg Oral Daily Skip Mayer A, MD   75 mg at 12/15/18 0929  . heparin injection 5,000 Units  5,000 Units Subcutaneous Q8H Skip Mayer A, MD   5,000 Units at 12/15/18 1319  . hydrALAZINE (APRESOLINE) tablet 10 mg  10 mg Oral Q6H PRN Lolita Patella B, MD   10 mg at 12/15/18 1117  . labetalol (NORMODYNE) tablet 100 mg  100 mg Oral BID Lolita Patella B, MD   100 mg at 12/15/18 1319  . ondansetron (ZOFRAN) injection 4 mg  4 mg Intravenous Q6H PRN Jimmye Norman, NP   4 mg at 12/14/18 0230  . rosuvastatin (CRESTOR) tablet 40 mg  40 mg Oral Daily Sreenath, Sudheer B, MD      . senna-docusate (Senokot-S) tablet 1 tablet  1  tablet Oral QHS PRN Lurline Del, MD        History reviewed. No pertinent past medical history.  History reviewed. No pertinent surgical history.  Social History Social History   Tobacco Use  . Smoking status: Never Smoker  . Smokeless tobacco: Never Used  Substance Use Topics  . Alcohol use: No    Frequency: Never  . Drug use: No   Family History Family History  Problem Relation Age of Onset  . Hypertension Mother   . Hypertension Father   Denies family history of atherosclerotic disease, venous disease or bleeding/clotting disorders.  No Known Allergies  REVIEW OF  SYSTEMS (Negative unless checked)  Constitutional: [] Weight loss  [] Fever  [] Chills Cardiac: [] Chest pain   [] Chest pressure   [] Palpitations   [] Shortness of breath when laying flat   [] Shortness of breath at rest   [] Shortness of breath with exertion. Vascular:  [] Pain in legs with walking   [] Pain in legs at rest   [] Pain in legs when laying flat   [] Claudication   [] Pain in feet when walking  [] Pain in feet at rest  [] Pain in feet when laying flat   [] History of DVT   [] Phlebitis   [] Swelling in legs   [] Varicose veins   [] Non-healing ulcers Pulmonary:   [] Uses home oxygen   [] Productive cough   [] Hemoptysis   [] Wheeze  [] COPD   [] Asthma Neurologic:  [x] Dizziness  [] Blackouts   [] Seizures   [] History of stroke   [] History of TIA  [] Aphasia   [] Temporary blindness   [] Dysphagia   [] Weakness or numbness in arms   [] Weakness or numbness in legs Musculoskeletal:  [] Arthritis   [] Joint swelling   [] Joint pain   [] Low back pain Hematologic:  [] Easy bruising  [] Easy bleeding   [] Hypercoagulable state   [] Anemic  [] Hepatitis Gastrointestinal:  [] Blood in stool   [] Vomiting blood  [] Gastroesophageal reflux/heartburn   [] Difficulty swallowing. Positive for nausea. Genitourinary:  [] Chronic kidney disease   [] Difficult urination  [] Frequent urination  [] Burning with urination   [] Blood in urine Skin:  [] Rashes   [] Ulcers   [] Wounds Psychological:  [] History of anxiety   [x]  History of major depression.  Physical Examination  Vitals:   12/15/18 0104 12/15/18 0158 12/15/18 0440 12/15/18 0730  BP: (!) 185/86 (!) 174/84 (!) 160/71 (!) 165/88  Pulse: 65 (!) 57 (!) 55 (!) 57  Resp: (!) 24  20 20   Temp: 97.8 F (36.6 C)  98 F (36.7 C) 97.7 F (36.5 C)  TempSrc:   Oral Axillary  SpO2: 99% 95% 98% 96%  Weight:      Height:       Body mass index is 28.46 kg/m. Gen:  WD/WN, NAD Head: Hundred/AT, No temporalis wasting. Prominent temp pulse not noted. Ear/Nose/Throat: Hearing grossly intact, nares w/o  erythema or drainage, oropharynx w/o Erythema/Exudate.  Eyes: Sclera non-icteric, conjunctiva clear. Nystagmus noted bilaterally.  Neck: Trachea midline.  No JVD. No bruits noted.  Pulmonary:  Good air movement, respirations not labored, equal bilaterally.  Cardiac: RRR, normal S1, S2. Vascular:  Vessel Right Left  Radial Palpable Palpable  Ulnar Palpable Palpable  Brachial Palpable Palpable  Carotid Palpable, without bruit Palpable, without bruit  Aorta Not palpable N/A  Femoral Palpable Palpable  Popliteal Palpable Palpable  PT Palpable Palpable  DP Palpable Palpable   Gastrointestinal: soft, non-tender/non-distended. No guarding/reflex.  Musculoskeletal: M/S 5/5 throughout.  Extremities without ischemic changes.  No deformity or atrophy. No edema. Neurologic: Sensation grossly  intact in extremities.  Symmetrical.  Speech is fluent. Motor exam as listed above. Psychiatric: Judgment intact, Mood & affect appropriate for pt's clinical situation. Dermatologic: No rashes or ulcers noted.  No cellulitis or open wounds. Lymph : No Cervical, Axillary, or Inguinal lymphadenopathy.  CBC Lab Results  Component Value Date   WBC 8.8 12/14/2018   HGB 14.0 12/14/2018   HCT 40.5 12/14/2018   MCV 87.7 12/14/2018   PLT 279 12/14/2018   BMET    Component Value Date/Time   NA 141 12/13/2018 1647   K 3.7 12/13/2018 1647   CL 106 12/13/2018 1647   CO2 23 12/13/2018 1647   GLUCOSE 145 (H) 12/13/2018 1647   BUN 25 (H) 12/13/2018 1647   CREATININE 0.71 12/14/2018 0643   CALCIUM 9.7 12/13/2018 1647   GFRNONAA >60 12/14/2018 0643   GFRAA >60 12/14/2018 0643   Estimated Creatinine Clearance: 96.4 mL/min (by C-G formula based on SCr of 0.71 mg/dL).  COAG No results found for: INR, PROTIME  Radiology Ct Angio Head W Or Wo Contrast  Result Date: 12/14/2018 CLINICAL DATA:  68 year old male with sudden onset dizziness with no acute infarct but heavily calcified ectatic distal vertebral  arteries on head CT and MRI earlier today. EXAM: CT ANGIOGRAPHY HEAD AND NECK TECHNIQUE: Multidetector CT imaging of the head and neck was performed using the standard protocol during bolus administration of intravenous contrast. Multiplanar CT image reconstructions and MIPs were obtained to evaluate the vascular anatomy. Carotid stenosis measurements (when applicable) are obtained utilizing NASCET criteria, using the distal internal carotid diameter as the denominator. CONTRAST:  75mL OMNIPAQUE IOHEXOL 350 MG/ML SOLN COMPARISON:  brain MRI and head CT earlier tonight. FINDINGS: CT HEAD Brain: Stable gray-white matter differentiation throughout the brain. No acute intracranial abnormality. Calvarium and skull base: No acute osseous abnormality identified. Paranasal sinuses: Visualized paranasal sinuses and mastoids are stable and well pneumatized. Orbits: Visualized orbits and scalp soft tissues are within normal limits. CTA NECK Skeleton: Carious left mandible dentition. No acute osseous abnormality identified. Upper chest: Negative visible upper lungs aside from mild atelectasis. Mild mediastinal lipomatosis. No superior mediastinal lymphadenopathy. Other neck: No acute findings. Aortic arch: Calcified aortic atherosclerosis. 3 vessel arch configuration. Right carotid system: Tortuous brachiocephalic artery with mild plaque and no stenosis. Tortuous proximal right carotid artery with a kinked appearance at the thoracic inlet but no other stenosis. Soft and calcified plaque at the right ICA origin and bulb with no associated stenosis. Tortuous right ICA just below the skull base. Left carotid system: Mildly tortuous proximal left CCA without stenosis. Partially retropharyngeal course. Calcified plaque at the left ICA origin resulting in less than 50 % stenosis with respect to the distal vessel. Tortuous left ICA just below the skull base. Vertebral arteries: Tortuous proximal right subclavian artery without  stenosis. Normal right vertebral artery origin on series 10, image 537. Patent right vertebral artery to the skull base with tortuosity but no stenosis. Mild plaque in the proximal left subclavian artery without stenosis. Normal left vertebral artery origin. Tortuous left V1 segment with a kinked appearance but no other stenosis. Patent left vertebral artery to the skull base with mild tortuosity, no stenosis. CTA HEAD Posterior circulation: Heavily calcified bilateral vertebral artery V4 segments with radiographic string sign stenosis as seen on series 10, images 274, 271 and series 12, images 133 and 131. On the right the high-grade stenosis is proximal to the right PICA origin which remains patent on series 10, image 260. Both vertebral  arteries do remain patent to the vertebrobasilar junction, and this despite a 2nd level of tandem string sign stenosis of the distal left V4 segment just proximal to the vertebrobasilar junction (series 12, image 122. Furthermore, there is a small 2 millimeter saccular aneurysm versus atherosclerotic pseudo-lesion of the left V4 segment seen on series 12, image 129. The left AICA appears to be dominant and patent. Patent basilar artery with mild irregularity but no stenosis. Patent SCA and PCA origins. Small posterior communicating arteries. Moderate to severe left PCA P2 segment stenosis on series 16, image 21. Preserved left PCA branch enhancement. Mild to moderate contralateral right P2 stenosis (series 14, image 20) also with preserved distal enhancement. Anterior circulation: Both ICA siphons are patent. On the left there is moderate to severe calcified plaque and moderate to severe supraclinoid stenosis on series 10, image 219. On the right side there is similar extensive calcified plaque, with moderate to severe cavernous and severe supraclinoid right ICA stenoses (series 10, image 220). Normal ophthalmic and posterior communicating artery origins. Patent carotid termini.  MCA and ACA origins are within normal limits. There is bilateral A1 segment irregularity without stenosis. Diminutive anterior communicating artery. Bilateral ACA branches are within normal limits. There is moderate irregularity in the left MCA M1 segment without stenosis. Left MCA bifurcation is patent. Left MCA branches appear patent with mild stenosis. On the right there is moderate to severe irregularity and at least moderate stenosis of the right MCA M1 segment (series 14, images 20 and 21). The right MCA bifurcation is patent but irregular with moderate to severe proximal M2 stenosis on series 15, image 18. However, no right MCA branch occlusion is identified. Venous sinuses: Patent. Anatomic variants: None. Review of the MIP images confirms the above findings IMPRESSION: 1. Negative for large vessel occlusion, but positive for high-grade RADIOGRAPHIC STRING SIGN stenoses due to bulky calcified plaque of: - Left vertebral artery V4 segment (tandem string sign stenoses). - Right Vertebral artery V4 segment. - Right ICA supraclinoid segment. 2. Additionally, there are moderate to severe atherosclerotic stenoses of: - Left ICA supraclinoid segment. - Right ICA cavernous segment. - Right MCA M1 segment and MCA bifurcation. - Left PCA P2 segment. 3. Furthermore, there is a small 2 mm saccular aneurysm versus atherosclerotic pseudo-lesion of the left vertebral V4 segment (series 12, image 129). 4. No significant Arterial stenosis in the neck despite atherosclerosis. 5.  Stable CT appearance of the brain. Electronically Signed   By: Odessa Fleming M.D.   On: 12/14/2018 01:02   Ct Head Wo Contrast  Result Date: 12/13/2018 CLINICAL DATA:  Dizziness, nausea, vomiting EXAM: CT HEAD WITHOUT CONTRAST TECHNIQUE: Contiguous axial images were obtained from the base of the skull through the vertex without intravenous contrast. COMPARISON:  None. FINDINGS: Brain: There is atrophy and chronic small vessel disease changes. No acute  intracranial abnormality. Specifically, no hemorrhage, hydrocephalus, mass lesion, acute infarction, or significant intracranial injury. Vascular: No hyperdense vessel or unexpected calcification. Skull: No acute calvarial abnormality. Sinuses/Orbits: Visualized paranasal sinuses and mastoids clear. Orbital soft tissues unremarkable. Other: None IMPRESSION: Atrophy, chronic microvascular disease. No acute intracranial abnormality. Electronically Signed   By: Charlett Nose M.D.   On: 12/13/2018 19:23   Ct Angio Neck W And/or Wo Contrast  Result Date: 12/14/2018 CLINICAL DATA:  68 year old male with sudden onset dizziness with no acute infarct but heavily calcified ectatic distal vertebral arteries on head CT and MRI earlier today. EXAM: CT ANGIOGRAPHY HEAD AND NECK TECHNIQUE: Multidetector  CT imaging of the head and neck was performed using the standard protocol during bolus administration of intravenous contrast. Multiplanar CT image reconstructions and MIPs were obtained to evaluate the vascular anatomy. Carotid stenosis measurements (when applicable) are obtained utilizing NASCET criteria, using the distal internal carotid diameter as the denominator. CONTRAST:  74mL OMNIPAQUE IOHEXOL 350 MG/ML SOLN COMPARISON:  brain MRI and head CT earlier tonight. FINDINGS: CT HEAD Brain: Stable gray-white matter differentiation throughout the brain. No acute intracranial abnormality. Calvarium and skull base: No acute osseous abnormality identified. Paranasal sinuses: Visualized paranasal sinuses and mastoids are stable and well pneumatized. Orbits: Visualized orbits and scalp soft tissues are within normal limits. CTA NECK Skeleton: Carious left mandible dentition. No acute osseous abnormality identified. Upper chest: Negative visible upper lungs aside from mild atelectasis. Mild mediastinal lipomatosis. No superior mediastinal lymphadenopathy. Other neck: No acute findings. Aortic arch: Calcified aortic atherosclerosis. 3  vessel arch configuration. Right carotid system: Tortuous brachiocephalic artery with mild plaque and no stenosis. Tortuous proximal right carotid artery with a kinked appearance at the thoracic inlet but no other stenosis. Soft and calcified plaque at the right ICA origin and bulb with no associated stenosis. Tortuous right ICA just below the skull base. Left carotid system: Mildly tortuous proximal left CCA without stenosis. Partially retropharyngeal course. Calcified plaque at the left ICA origin resulting in less than 50 % stenosis with respect to the distal vessel. Tortuous left ICA just below the skull base. Vertebral arteries: Tortuous proximal right subclavian artery without stenosis. Normal right vertebral artery origin on series 10, image 537. Patent right vertebral artery to the skull base with tortuosity but no stenosis. Mild plaque in the proximal left subclavian artery without stenosis. Normal left vertebral artery origin. Tortuous left V1 segment with a kinked appearance but no other stenosis. Patent left vertebral artery to the skull base with mild tortuosity, no stenosis. CTA HEAD Posterior circulation: Heavily calcified bilateral vertebral artery V4 segments with radiographic string sign stenosis as seen on series 10, images 274, 271 and series 12, images 133 and 131. On the right the high-grade stenosis is proximal to the right PICA origin which remains patent on series 10, image 260. Both vertebral arteries do remain patent to the vertebrobasilar junction, and this despite a 2nd level of tandem string sign stenosis of the distal left V4 segment just proximal to the vertebrobasilar junction (series 12, image 122. Furthermore, there is a small 2 millimeter saccular aneurysm versus atherosclerotic pseudo-lesion of the left V4 segment seen on series 12, image 129. The left AICA appears to be dominant and patent. Patent basilar artery with mild irregularity but no stenosis. Patent SCA and PCA origins.  Small posterior communicating arteries. Moderate to severe left PCA P2 segment stenosis on series 16, image 21. Preserved left PCA branch enhancement. Mild to moderate contralateral right P2 stenosis (series 14, image 20) also with preserved distal enhancement. Anterior circulation: Both ICA siphons are patent. On the left there is moderate to severe calcified plaque and moderate to severe supraclinoid stenosis on series 10, image 219. On the right side there is similar extensive calcified plaque, with moderate to severe cavernous and severe supraclinoid right ICA stenoses (series 10, image 220). Normal ophthalmic and posterior communicating artery origins. Patent carotid termini. MCA and ACA origins are within normal limits. There is bilateral A1 segment irregularity without stenosis. Diminutive anterior communicating artery. Bilateral ACA branches are within normal limits. There is moderate irregularity in the left MCA M1 segment without stenosis.  Left MCA bifurcation is patent. Left MCA branches appear patent with mild stenosis. On the right there is moderate to severe irregularity and at least moderate stenosis of the right MCA M1 segment (series 14, images 20 and 21). The right MCA bifurcation is patent but irregular with moderate to severe proximal M2 stenosis on series 15, image 18. However, no right MCA branch occlusion is identified. Venous sinuses: Patent. Anatomic variants: None. Review of the MIP images confirms the above findings IMPRESSION: 1. Negative for large vessel occlusion, but positive for high-grade RADIOGRAPHIC STRING SIGN stenoses due to bulky calcified plaque of: - Left vertebral artery V4 segment (tandem string sign stenoses). - Right Vertebral artery V4 segment. - Right ICA supraclinoid segment. 2. Additionally, there are moderate to severe atherosclerotic stenoses of: - Left ICA supraclinoid segment. - Right ICA cavernous segment. - Right MCA M1 segment and MCA bifurcation. - Left PCA P2  segment. 3. Furthermore, there is a small 2 mm saccular aneurysm versus atherosclerotic pseudo-lesion of the left vertebral V4 segment (series 12, image 129). 4. No significant Arterial stenosis in the neck despite atherosclerosis. 5.  Stable CT appearance of the brain. Electronically Signed   By: Odessa Fleming M.D.   On: 12/14/2018 01:02   Mr Brain Wo Contrast  Result Date: 12/13/2018 CLINICAL DATA:  68 year old male with sudden onset dizziness, nausea vomiting. EXAM: MRI HEAD WITHOUT CONTRAST TECHNIQUE: Multiplanar, multiecho pulse sequences of the brain and surrounding structures were obtained without intravenous contrast. COMPARISON:  Head CT earlier tonight. FINDINGS: Brain: No restricted diffusion to suggest acute infarction. No midline shift, mass effect, evidence of mass lesion, ventriculomegaly, extra-axial collection or acute intracranial hemorrhage. Cervicomedullary junction and pituitary are within normal limits. Patchy and confluent bilateral cerebral white matter T2 and FLAIR hyperintensity in a nonspecific configuration. No chronic cerebral blood products or cortical encephalomalacia identified. Minimal T2 heterogeneity in the deep gray matter nuclei. Brainstem and cerebellum appear normal. Vascular: Major intracranial vascular flow voids are preserved. Dolichoectatic distal vertebral arteries, heavily calcified on the CT earlier today. Skull and upper cervical spine: Negative visible cervical spine. Visualized bone marrow signal is within normal limits. Sinuses/Orbits: Negative orbits. Paranasal sinuses are clear. Other: Mastoids are clear. Visible internal auditory structures appear normal. Stylomastoid foramina appear within normal limits. Scalp and face soft tissues appear negative. IMPRESSION: 1. Negative for acute infarct or acute intracranial abnormality. 2. Heavily calcified and dolichoectatic distal vertebral arteries. There is no evidence of a prior posterior circulation infarct, but consider  the possibility of vertebrobasilar insufficiency in this clinical setting. 3. Moderate for age nonspecific cerebral white matter signal changes, most commonly due to chronic small vessel disease. Electronically Signed   By: Odessa Fleming M.D.   On: 12/13/2018 20:46   Assessment/Plan The patient is a 68 year old male with personality disorder, depression and autism who presented to the [facility] emergency department on December 13, 2018 with a chief complaint of "dizziness". 1. Carotid Artery Stenosis: Patient presents with severe dizziness and nausea.  MRI of brain without acute changes.  CTA head notable for significant cerebral atherosclerotic disease.  Reviewed CTA of neck (with Dr. Wyn Quaker) - no significant atherosclerotic disease noted in the bilateral carotid arteries.  No indication for any endovascular or open vascular intervention at this time.  Recommend outpatient follow-up for continued surveillance.  Agree with initiation of aspirin and Plavix.  Would also start patient on statin. 2. Depression: Symptoms seem to be controlled at this time.  Patient should continue  to follow-up with his primary care physician or psychiatrist.  Discussed with Dr. Weldon Inches, PA-C  12/15/2018 1:42 PM  This note was created with Dragon medical transcription system.  Any error is purely unintentional

## 2018-12-15 NOTE — Plan of Care (Signed)
  Problem: Education: Goal: Knowledge of General Education information will improve Description: Including pain rating scale, medication(s)/side effects and non-pharmacologic comfort measures Outcome: Progressing   Problem: Health Behavior/Discharge Planning: Goal: Ability to manage health-related needs will improve Outcome: Not Progressing Note: B.P. was over 978 systolic earlier today. Multiple PO medications administered. Orthostatic vitals also taken. Patient to d/c in the AM. Will continue to monitor. [facility]

## 2018-12-15 NOTE — Progress Notes (Signed)
SLP Cancellation Note  Patient Details Name: KARANVIR BALDERSTON MRN: [id number] DOB: 1950-08-11   Cancelled treatment:       Reason Eval/Treat Not Completed: SLP screened, no needs identified, will sign off(chart reviewed; consulted NSG then met w/ pt in room)  Pt denied any difficulty swallowing and is currently on a regular diet; tolerates swallowing pills w/ water per NSG. Pt conversed at conversational level w/out deficits noted; good sense of humor. Pt and NSG/MD(neuro) denied any speech-language deficits during assessments.  No further skilled ST services indicated as pt appears at his baseline. Pt agreed. NSG to reconsult if any change in status.     Orinda Kenner, MS, CCC-SLP Watson,Katherine 12/15/2018, 1:19 PM

## 2018-12-15 NOTE — Progress Notes (Signed)
Subjective: Patient reports that he feels the Meclizine is helping his dizziness and that he is improved.  No new neurological complaints.   Objective: Current vital signs: BP (!) 165/88 (BP Location: Right Arm)   Pulse (!) 57   Temp 97.7 F (36.5 C) (Axillary)   Resp 20   Ht 5\' 8"  (1.727 m)   Wt 84.9 kg   SpO2 96%   BMI 28.46 kg/m  Vital signs in last 24 hours: Temp:  [97.7 F (36.5 C)-98.2 F (36.8 C)] 97.7 F (36.5 C) (12/02 0730) Pulse Rate:  [50-65] 57 (12/02 0730) Resp:  [18-24] 20 (12/02 0730) BP: (127-185)/(59-88) 165/88 (12/02 0730) SpO2:  [95 %-99 %] 96 % (12/02 0730) Weight:  [84.9 kg] 84.9 kg (12/02 0100)  Intake/Output from previous day: 12/01 0701 - 12/02 0700 In: 1629.4 [I.V.:1629.4] Out: 300 [Urine:300] Intake/Output this shift: Total I/O In: -  Out: 325 [Urine:325] Nutritional status:  Diet Order            Diet Heart Room service appropriate? Yes; Fluid consistency: Thin  Diet effective now              Neurologic Exam: Mental Status: Alert, oriented, thought content appropriate.  Speech fluent without evidence of aphasia.  Able to follow 3 step commands without difficulty. Cranial Nerves: II: Visual fields grossly normal, pupils equal, round, reactive to light and accommodation III,IV, VI: ptosis not present, extra-ocular motions intact with nystagmus noted on all directions, most prominent of right lateral gaze V,VII: smile symmetric, facial light touch sensation normal bilaterally VIII: hearing normal bilaterally IX,X: gag reflex present XI: bilateral shoulder shrug XII: midline tongue extension Motor: Right :  Upper extremity   5/5                                      Left:     Upper extremity   5/5             Lower extremity   5/5                                                  Lower extremity   5/5 Tone and bulk:normal tone throughout; no atrophy noted Sensory: Pinprick and light touch intact throughout, bilaterally   Lab  Results: Basic Metabolic Panel: Recent Labs  Lab 12/13/18 1647 12/14/18 0643  NA 141  --   K 3.7  --   CL 106  --   CO2 23  --   GLUCOSE 145*  --   BUN 25*  --   CREATININE 1.03 0.71  CALCIUM 9.7  --     Liver Function Tests: Recent Labs  Lab 12/13/18 1647  AST 27  ALT 24  ALKPHOS 71  BILITOT 0.6  PROT 8.3*  ALBUMIN 4.7   No results for input(s): LIPASE, AMYLASE in the last 168 hours. No results for input(s): AMMONIA in the last 168 hours.  CBC: Recent Labs  Lab 12/13/18 1647 12/14/18 0643  WBC 11.0* 8.8  HGB 15.5 14.0  HCT 44.3 40.5  MCV 87.7 87.7  PLT 304 279    Cardiac Enzymes: No results for input(s): CKTOTAL, CKMB, CKMBINDEX, TROPONINI in the last 168 hours.  Lipid Panel: Recent Labs  Lab 12/14/18 [phone]  CHOL 227*  TRIG 75  HDL 45  CHOLHDL 5.0  VLDL 15  LDLCALC 161*    CBG: No results for input(s): GLUCAP in the last 168 hours.  Microbiology: Results for orders placed or performed during the hospital encounter of 12/13/18  SARS CORONAVIRUS 2 (TAT 6-24 HRS) Nasopharyngeal Nasopharyngeal Swab     Status: None   Collection Time: 12/13/18 11:41 PM   Specimen: Nasopharyngeal Swab  Result Value Ref Range Status   SARS Coronavirus 2 NEGATIVE NEGATIVE Final    Comment: (NOTE) SARS-CoV-2 target nucleic acids are NOT DETECTED. The SARS-CoV-2 RNA is generally detectable in upper and lower respiratory specimens during the acute phase of infection. Negative results do not preclude SARS-CoV-2 infection, do not rule out co-infections with other pathogens, and should not be used as the sole basis for treatment or other patient management decisions. Negative results must be combined with clinical observations, patient history, and epidemiological information. The expected result is Negative. Fact Sheet for Patients: HairSlick.no Fact Sheet for Healthcare Providers: quierodirigir.com This test is  not yet approved or cleared by the [place] FDA and  has been authorized for detection and/or diagnosis of SARS-CoV-2 by FDA under an Emergency Use Authorization (EUA). This EUA will remain  in effect (meaning this test can be used) for the duration of the COVID-19 declaration under Section 56 4(b)(1) of the Act, 21 U.S.C. section 360bbb-3(b)(1), unless the authorization is terminated or revoked sooner. Performed at [facility], [address], [place], [place] [address]     Coagulation Studies: No results for input(s): LABPROT, INR in the last 72 hours.  Imaging: Ct Angio Head W Or Wo Contrast  Result Date: 12/14/2018 CLINICAL DATA:  68 year old male with sudden onset dizziness with no acute infarct but heavily calcified ectatic distal vertebral arteries on head CT and MRI earlier today. EXAM: CT ANGIOGRAPHY HEAD AND NECK TECHNIQUE: Multidetector CT imaging of the head and neck was performed using the standard protocol during bolus administration of intravenous contrast. Multiplanar CT image reconstructions and MIPs were obtained to evaluate the vascular anatomy. Carotid stenosis measurements (when applicable) are obtained utilizing NASCET criteria, using the distal internal carotid diameter as the denominator. CONTRAST:  75mL OMNIPAQUE IOHEXOL 350 MG/ML SOLN COMPARISON:  brain MRI and head CT earlier tonight. FINDINGS: CT HEAD Brain: Stable gray-white matter differentiation throughout the brain. No acute intracranial abnormality. Calvarium and skull base: No acute osseous abnormality identified. Paranasal sinuses: Visualized paranasal sinuses and mastoids are stable and well pneumatized. Orbits: Visualized orbits and scalp soft tissues are within normal limits. CTA NECK Skeleton: Carious left mandible dentition. No acute osseous abnormality identified. Upper chest: Negative visible upper lungs aside from mild atelectasis. Mild mediastinal lipomatosis. No superior mediastinal  lymphadenopathy. Other neck: No acute findings. Aortic arch: Calcified aortic atherosclerosis. 3 vessel arch configuration. Right carotid system: Tortuous brachiocephalic artery with mild plaque and no stenosis. Tortuous proximal right carotid artery with a kinked appearance at the thoracic inlet but no other stenosis. Soft and calcified plaque at the right ICA origin and bulb with no associated stenosis. Tortuous right ICA just below the skull base. Left carotid system: Mildly tortuous proximal left CCA without stenosis. Partially retropharyngeal course. Calcified plaque at the left ICA origin resulting in less than 50 % stenosis with respect to the distal vessel. Tortuous left ICA just below the skull base. Vertebral arteries: Tortuous proximal right subclavian artery without stenosis. Normal right vertebral artery origin on series 10, image 537. Patent right  vertebral artery to the skull base with tortuosity but no stenosis. Mild plaque in the proximal left subclavian artery without stenosis. Normal left vertebral artery origin. Tortuous left V1 segment with a kinked appearance but no other stenosis. Patent left vertebral artery to the skull base with mild tortuosity, no stenosis. CTA HEAD Posterior circulation: Heavily calcified bilateral vertebral artery V4 segments with radiographic string sign stenosis as seen on series 10, images 274, 271 and series 12, images 133 and 131. On the right the high-grade stenosis is proximal to the right PICA origin which remains patent on series 10, image 260. Both vertebral arteries do remain patent to the vertebrobasilar junction, and this despite a 2nd level of tandem string sign stenosis of the distal left V4 segment just proximal to the vertebrobasilar junction (series 12, image 122. Furthermore, there is a small 2 millimeter saccular aneurysm versus atherosclerotic pseudo-lesion of the left V4 segment seen on series 12, image 129. The left AICA appears to be dominant and  patent. Patent basilar artery with mild irregularity but no stenosis. Patent SCA and PCA origins. Small posterior communicating arteries. Moderate to severe left PCA P2 segment stenosis on series 16, image 21. Preserved left PCA branch enhancement. Mild to moderate contralateral right P2 stenosis (series 14, image 20) also with preserved distal enhancement. Anterior circulation: Both ICA siphons are patent. On the left there is moderate to severe calcified plaque and moderate to severe supraclinoid stenosis on series 10, image 219. On the right side there is similar extensive calcified plaque, with moderate to severe cavernous and severe supraclinoid right ICA stenoses (series 10, image 220). Normal ophthalmic and posterior communicating artery origins. Patent carotid termini. MCA and ACA origins are within normal limits. There is bilateral A1 segment irregularity without stenosis. Diminutive anterior communicating artery. Bilateral ACA branches are within normal limits. There is moderate irregularity in the left MCA M1 segment without stenosis. Left MCA bifurcation is patent. Left MCA branches appear patent with mild stenosis. On the right there is moderate to severe irregularity and at least moderate stenosis of the right MCA M1 segment (series 14, images 20 and 21). The right MCA bifurcation is patent but irregular with moderate to severe proximal M2 stenosis on series 15, image 18. However, no right MCA branch occlusion is identified. Venous sinuses: Patent. Anatomic variants: None. Review of the MIP images confirms the above findings IMPRESSION: 1. Negative for large vessel occlusion, but positive for high-grade RADIOGRAPHIC STRING SIGN stenoses due to bulky calcified plaque of: - Left vertebral artery V4 segment (tandem string sign stenoses). - Right Vertebral artery V4 segment. - Right ICA supraclinoid segment. 2. Additionally, there are moderate to severe atherosclerotic stenoses of: - Left ICA supraclinoid  segment. - Right ICA cavernous segment. - Right MCA M1 segment and MCA bifurcation. - Left PCA P2 segment. 3. Furthermore, there is a small 2 mm saccular aneurysm versus atherosclerotic pseudo-lesion of the left vertebral V4 segment (series 12, image 129). 4. No significant Arterial stenosis in the neck despite atherosclerosis. 5.  Stable CT appearance of the brain. Electronically Signed   By: Odessa Fleming M.D.   On: 12/14/2018 01:02   Ct Head Wo Contrast  Result Date: 12/13/2018 CLINICAL DATA:  Dizziness, nausea, vomiting EXAM: CT HEAD WITHOUT CONTRAST TECHNIQUE: Contiguous axial images were obtained from the base of the skull through the vertex without intravenous contrast. COMPARISON:  None. FINDINGS: Brain: There is atrophy and chronic small vessel disease changes. No acute intracranial abnormality. Specifically, no hemorrhage,  hydrocephalus, mass lesion, acute infarction, or significant intracranial injury. Vascular: No hyperdense vessel or unexpected calcification. Skull: No acute calvarial abnormality. Sinuses/Orbits: Visualized paranasal sinuses and mastoids clear. Orbital soft tissues unremarkable. Other: None IMPRESSION: Atrophy, chronic microvascular disease. No acute intracranial abnormality. Electronically Signed   By: Charlett Nose M.D.   On: 12/13/2018 19:23   Ct Angio Neck W And/or Wo Contrast  Result Date: 12/14/2018 CLINICAL DATA:  68 year old male with sudden onset dizziness with no acute infarct but heavily calcified ectatic distal vertebral arteries on head CT and MRI earlier today. EXAM: CT ANGIOGRAPHY HEAD AND NECK TECHNIQUE: Multidetector CT imaging of the head and neck was performed using the standard protocol during bolus administration of intravenous contrast. Multiplanar CT image reconstructions and MIPs were obtained to evaluate the vascular anatomy. Carotid stenosis measurements (when applicable) are obtained utilizing NASCET criteria, using the distal internal carotid diameter as  the denominator. CONTRAST:  75mL OMNIPAQUE IOHEXOL 350 MG/ML SOLN COMPARISON:  brain MRI and head CT earlier tonight. FINDINGS: CT HEAD Brain: Stable gray-white matter differentiation throughout the brain. No acute intracranial abnormality. Calvarium and skull base: No acute osseous abnormality identified. Paranasal sinuses: Visualized paranasal sinuses and mastoids are stable and well pneumatized. Orbits: Visualized orbits and scalp soft tissues are within normal limits. CTA NECK Skeleton: Carious left mandible dentition. No acute osseous abnormality identified. Upper chest: Negative visible upper lungs aside from mild atelectasis. Mild mediastinal lipomatosis. No superior mediastinal lymphadenopathy. Other neck: No acute findings. Aortic arch: Calcified aortic atherosclerosis. 3 vessel arch configuration. Right carotid system: Tortuous brachiocephalic artery with mild plaque and no stenosis. Tortuous proximal right carotid artery with a kinked appearance at the thoracic inlet but no other stenosis. Soft and calcified plaque at the right ICA origin and bulb with no associated stenosis. Tortuous right ICA just below the skull base. Left carotid system: Mildly tortuous proximal left CCA without stenosis. Partially retropharyngeal course. Calcified plaque at the left ICA origin resulting in less than 50 % stenosis with respect to the distal vessel. Tortuous left ICA just below the skull base. Vertebral arteries: Tortuous proximal right subclavian artery without stenosis. Normal right vertebral artery origin on series 10, image 537. Patent right vertebral artery to the skull base with tortuosity but no stenosis. Mild plaque in the proximal left subclavian artery without stenosis. Normal left vertebral artery origin. Tortuous left V1 segment with a kinked appearance but no other stenosis. Patent left vertebral artery to the skull base with mild tortuosity, no stenosis. CTA HEAD Posterior circulation: Heavily calcified  bilateral vertebral artery V4 segments with radiographic string sign stenosis as seen on series 10, images 274, 271 and series 12, images 133 and 131. On the right the high-grade stenosis is proximal to the right PICA origin which remains patent on series 10, image 260. Both vertebral arteries do remain patent to the vertebrobasilar junction, and this despite a 2nd level of tandem string sign stenosis of the distal left V4 segment just proximal to the vertebrobasilar junction (series 12, image 122. Furthermore, there is a small 2 millimeter saccular aneurysm versus atherosclerotic pseudo-lesion of the left V4 segment seen on series 12, image 129. The left AICA appears to be dominant and patent. Patent basilar artery with mild irregularity but no stenosis. Patent SCA and PCA origins. Small posterior communicating arteries. Moderate to severe left PCA P2 segment stenosis on series 16, image 21. Preserved left PCA branch enhancement. Mild to moderate contralateral right P2 stenosis (series 14, image 20) also with  preserved distal enhancement. Anterior circulation: Both ICA siphons are patent. On the left there is moderate to severe calcified plaque and moderate to severe supraclinoid stenosis on series 10, image 219. On the right side there is similar extensive calcified plaque, with moderate to severe cavernous and severe supraclinoid right ICA stenoses (series 10, image 220). Normal ophthalmic and posterior communicating artery origins. Patent carotid termini. MCA and ACA origins are within normal limits. There is bilateral A1 segment irregularity without stenosis. Diminutive anterior communicating artery. Bilateral ACA branches are within normal limits. There is moderate irregularity in the left MCA M1 segment without stenosis. Left MCA bifurcation is patent. Left MCA branches appear patent with mild stenosis. On the right there is moderate to severe irregularity and at least moderate stenosis of the right MCA M1  segment (series 14, images 20 and 21). The right MCA bifurcation is patent but irregular with moderate to severe proximal M2 stenosis on series 15, image 18. However, no right MCA branch occlusion is identified. Venous sinuses: Patent. Anatomic variants: None. Review of the MIP images confirms the above findings IMPRESSION: 1. Negative for large vessel occlusion, but positive for high-grade RADIOGRAPHIC STRING SIGN stenoses due to bulky calcified plaque of: - Left vertebral artery V4 segment (tandem string sign stenoses). - Right Vertebral artery V4 segment. - Right ICA supraclinoid segment. 2. Additionally, there are moderate to severe atherosclerotic stenoses of: - Left ICA supraclinoid segment. - Right ICA cavernous segment. - Right MCA M1 segment and MCA bifurcation. - Left PCA P2 segment. 3. Furthermore, there is a small 2 mm saccular aneurysm versus atherosclerotic pseudo-lesion of the left vertebral V4 segment (series 12, image 129). 4. No significant Arterial stenosis in the neck despite atherosclerosis. 5.  Stable CT appearance of the brain. Electronically Signed   By: Odessa Fleming M.D.   On: 12/14/2018 01:02   Mr Brain Wo Contrast  Result Date: 12/13/2018 CLINICAL DATA:  68 year old male with sudden onset dizziness, nausea vomiting. EXAM: MRI HEAD WITHOUT CONTRAST TECHNIQUE: Multiplanar, multiecho pulse sequences of the brain and surrounding structures were obtained without intravenous contrast. COMPARISON:  Head CT earlier tonight. FINDINGS: Brain: No restricted diffusion to suggest acute infarction. No midline shift, mass effect, evidence of mass lesion, ventriculomegaly, extra-axial collection or acute intracranial hemorrhage. Cervicomedullary junction and pituitary are within normal limits. Patchy and confluent bilateral cerebral white matter T2 and FLAIR hyperintensity in a nonspecific configuration. No chronic cerebral blood products or cortical encephalomalacia identified. Minimal T2 heterogeneity  in the deep gray matter nuclei. Brainstem and cerebellum appear normal. Vascular: Major intracranial vascular flow voids are preserved. Dolichoectatic distal vertebral arteries, heavily calcified on the CT earlier today. Skull and upper cervical spine: Negative visible cervical spine. Visualized bone marrow signal is within normal limits. Sinuses/Orbits: Negative orbits. Paranasal sinuses are clear. Other: Mastoids are clear. Visible internal auditory structures appear normal. Stylomastoid foramina appear within normal limits. Scalp and face soft tissues appear negative. IMPRESSION: 1. Negative for acute infarct or acute intracranial abnormality. 2. Heavily calcified and dolichoectatic distal vertebral arteries. There is no evidence of a prior posterior circulation infarct, but consider the possibility of vertebrobasilar insufficiency in this clinical setting. 3. Moderate for age nonspecific cerebral white matter signal changes, most commonly due to chronic small vessel disease. Electronically Signed   By: Odessa Fleming M.D.   On: 12/13/2018 20:46    Medications:  I have reviewed the patient's current medications. Scheduled: .  stroke: mapping our early stages of recovery book  Does not apply Once  . [START ON 12/16/2018] amLODipine  10 mg Oral Daily  . amLODipine  5 mg Oral Once  . aspirin EC  81 mg Oral Daily  . clopidogrel  75 mg Oral Daily  . heparin  5,000 Units Subcutaneous Q8H  . labetalol  100 mg Oral BID    Assessment/Plan: 68 y.o. male with a history of personality disorder and depression who presented with acute onset of dizziness.  Neurological examination is significant for nystagmus.  No diplopia complaints.  MRI of the brain reviewed and shows no acute changes.  Chronic small vessel ischemic changes noted.  CTA of the head and neck shows evidence of high grade stenosis of the bilateral V4 segments and right supraclinoid ICA.  Moderate to severe stenosis noted of the left supraclinoid ICA,  right cavernous ICA, right M1 and left P2.  No large vessel occlusion noted.  Patient reports some improvement from Meclizine.  On ASA and Plavix.    Recommendations: 1. Vascular pending 2. Statin for lipid management with target LDL<70. 3. Follow up with neurology on an outpatient basis.      LOS: 1 day   Thana FarrLeslie Brinson Tozzi, MD Neurology [phone] 12/15/2018  11:40 AM

## 2018-12-15 NOTE — Progress Notes (Signed)
PROGRESS NOTE    Albert Leon  ZOX:[id number] DOB: Jun 01, 1950 DOA: 12/13/2018 PCP: Patient, No Pcp Per   Brief Narrative:  Per HPI: Albert Leon is a 68 y.o. male with medical history significant of  Personality disorder/depression who presents to the ED with complaints of refractory acute vertigo.  Patient states that he was on his way home when he acutely became very dizzy stated that he was unable to stand upright and had to immediately sit.  He states that symptoms continue to persist and was associated with nausea but no emesis.  On admission neurology was consulted.  Imaging suggestive of small vessel ischemic disease.  DAPT and statin initiated.  Vascular consulted, no operative intervention recommended.   Blood pressures markedly elevated.  Starting 2 agent treatment regimen   Assessment & Plan:   Active Problems:   TIA (transient ischemic attack)   Hypertensive Urgency Patient with no personal history of HTN, but has not seen MD in 10 years Strong family history of htn Given marked elevation in pressures, will start dual therapy Amlodipine  QD Labetalol  BID  Refractory vertigo with associated right-sided nystagmus  Possible TIA /CVA -Placed on TIA CVA protocol,  - echocardiogram: unrevealing - CTA of the head and neck pending as per neurology recommendation: small vessel dz -Continue Plavix 75 daily, aspirin 81 daily - Crestor 40 daily -Continue with neurochecks per protocol - Vascular consulted, no operative intervention indicated.  Recs appreciated  Depression/personality disorder -Resume outpatient medications as able  Mild leukocytosis -no signs of infection , continue to monitor lab  F EN Stable Replete lytes as needed IVFs per protocol   DVT prophylaxis: SQH Code Status: FULL Family Communication: None, offered to call patients family.  He declined Disposition Plan: Home within 24 hours.  Pending improved BP  control  Consultants:   Neurology: Thad Ranger  Vascular: Dew  Procedures:   TTE  Antimicrobials:   none   Subjective: Seen and examined No acute events No new complaints Subjective improvement reported  Objective: Vitals:   12/15/18 0104 12/15/18 0158 12/15/18 0440 12/15/18 0730  BP: (!) 185/86 (!) 174/84 (!) 160/71 (!) 165/88  Pulse: 65 (!) 57 (!) 55 (!) 57  Resp: (!) Temp: 97.8 F (36.6 C)  98 F (36.7 C) 97.7 F (36.5 C)  TempSrc:   Oral Axillary  SpO2: 99% 95% 98% 96%  Weight:      Height:        Intake/Output Summary (Last 24 hours) at 12/15/2018 1338 Last data filed at 12/15/2018 0815 Gross per 24 hour  Intake 1000 ml  Output 625 ml  Net 375 ml   Filed Weights   12/13/18 1621 12/15/18 0100  Weight: 74.8 kg 84.9 kg    Examination:  General exam: Appears calm and comfortable  Respiratory system: Clear to auscultation. Respiratory effort normal. Cardiovascular system: S1 & S2 heard, RRR. No JVD, murmurs, rubs, gallops or clicks. No pedal edema. Gastrointestinal system: Abdomen is nondistended, soft and nontender. No organomegaly or masses felt. Normal bowel sounds heard. Central nervous system: Alert and oriented. No focal neurological deficits. Extremities: Symmetric 5 x 5 power. Skin: No rashes, lesions or ulcers Psychiatry: Judgement and insight appear normal. Mood & affect appropriate.     Data Reviewed: I have personally reviewed following labs and imaging studies  CBC: Recent Labs  Lab 12/13/18 1647 12/14/18 0643  WBC 11.0* 8.8  HGB 15.5 14.0  HCT 44.3 40.5  MCV 87.7  87.7  PLT 304 279   Basic Metabolic Panel: Recent Labs  Lab 12/13/18 1647 12/14/18 0643  NA 141  --   K 3.7  --   CL 106  --   CO2 23  --   GLUCOSE 145*  --   BUN 25*  --   CREATININE 1.03 0.71  CALCIUM 9.7  --    GFR: Estimated Creatinine Clearance: 96.4 mL/min (by C-G formula based on SCr of 0.71 mg/dL). Liver Function Tests: Recent Labs  Lab  12/13/18 1647  AST 27  ALT 24  ALKPHOS 71  BILITOT 0.6  PROT 8.3*  ALBUMIN 4.7   No results for input(s): LIPASE, AMYLASE in the last 168 hours. No results for input(s): AMMONIA in the last 168 hours. Coagulation Profile: No results for input(s): INR, PROTIME in the last 168 hours. Cardiac Enzymes: No results for input(s): CKTOTAL, CKMB, CKMBINDEX, TROPONINI in the last 168 hours. BNP (last 3 results) No results for input(s): PROBNP in the last 8760 hours. HbA1C: Recent Labs    12/14/18 0643  HGBA1C 5.2   CBG: No results for input(s): GLUCAP in the last 168 hours. Lipid Profile: Recent Labs    12/14/18 0643  CHOL 227*  HDL 45  LDLCALC 167*  TRIG 75  CHOLHDL 5.0   Thyroid Function Tests: No results for input(s): TSH, T4TOTAL, FREET4, T3FREE, THYROIDAB in the last 72 hours. Anemia Panel: No results for input(s): VITAMINB12, FOLATE, FERRITIN, TIBC, IRON, RETICCTPCT in the last 72 hours. Sepsis Labs: No results for input(s): PROCALCITON, LATICACIDVEN in the last 168 hours.  Recent Results (from the past 240 hour(s))  SARS CORONAVIRUS 2 (TAT 6-24 HRS) Nasopharyngeal Nasopharyngeal Swab     Status: None   Collection Time: 12/13/18 11:41 PM   Specimen: Nasopharyngeal Swab  Result Value Ref Range Status   SARS Coronavirus 2 NEGATIVE NEGATIVE Final    Comment: (NOTE) SARS-CoV-2 target nucleic acids are NOT DETECTED. The SARS-CoV-2 RNA is generally detectable in upper and lower respiratory specimens during the acute phase of infection. Negative results do not preclude SARS-CoV-2 infection, do not rule out co-infections with other pathogens, and should not be used as the sole basis for treatment or other patient management decisions. Negative results must be combined with clinical observations, patient history, and epidemiological information. The expected result is Negative. Fact Sheet for Patients: HairSlick.no Fact Sheet for Healthcare  Providers: quierodirigir.com This test is not yet approved or cleared by the [place] FDA and  has been authorized for detection and/or diagnosis of SARS-CoV-2 by FDA under an Emergency Use Authorization (EUA). This EUA will remain  in effect (meaning this test can be used) for the duration of the COVID-19 declaration under Section 56 4(b)(1) of the Act, 21 U.S.C. section 360bbb-3(b)(1), unless the authorization is terminated or revoked sooner. Performed at [facility], [address], [place], [place] [address]          Radiology Studies: Ct Angio Head W Or Wo Contrast  Result Date: 12/14/2018 CLINICAL DATA:  68 year old male with sudden onset dizziness with no acute infarct but heavily calcified ectatic distal vertebral arteries on head CT and MRI earlier today. EXAM: CT ANGIOGRAPHY HEAD AND NECK TECHNIQUE: Multidetector CT imaging of the head and neck was performed using the standard protocol during bolus administration of intravenous contrast. Multiplanar CT image reconstructions and MIPs were obtained to evaluate the vascular anatomy. Carotid stenosis measurements (when applicable) are obtained utilizing NASCET criteria, using the distal internal carotid  diameter as the denominator. CONTRAST:  36mL OMNIPAQUE IOHEXOL 350 MG/ML SOLN COMPARISON:  brain MRI and head CT earlier tonight. FINDINGS: CT HEAD Brain: Stable gray-white matter differentiation throughout the brain. No acute intracranial abnormality. Calvarium and skull base: No acute osseous abnormality identified. Paranasal sinuses: Visualized paranasal sinuses and mastoids are stable and well pneumatized. Orbits: Visualized orbits and scalp soft tissues are within normal limits. CTA NECK Skeleton: Carious left mandible dentition. No acute osseous abnormality identified. Upper chest: Negative visible upper lungs aside from mild atelectasis. Mild mediastinal lipomatosis. No superior mediastinal  lymphadenopathy. Other neck: No acute findings. Aortic arch: Calcified aortic atherosclerosis. 3 vessel arch configuration. Right carotid system: Tortuous brachiocephalic artery with mild plaque and no stenosis. Tortuous proximal right carotid artery with a kinked appearance at the thoracic inlet but no other stenosis. Soft and calcified plaque at the right ICA origin and bulb with no associated stenosis. Tortuous right ICA just below the skull base. Left carotid system: Mildly tortuous proximal left CCA without stenosis. Partially retropharyngeal course. Calcified plaque at the left ICA origin resulting in less than 50 % stenosis with respect to the distal vessel. Tortuous left ICA just below the skull base. Vertebral arteries: Tortuous proximal right subclavian artery without stenosis. Normal right vertebral artery origin on series 10, image 537. Patent right vertebral artery to the skull base with tortuosity but no stenosis. Mild plaque in the proximal left subclavian artery without stenosis. Normal left vertebral artery origin. Tortuous left V1 segment with a kinked appearance but no other stenosis. Patent left vertebral artery to the skull base with mild tortuosity, no stenosis. CTA HEAD Posterior circulation: Heavily calcified bilateral vertebral artery V4 segments with radiographic string sign stenosis as seen on series 10, images 274, 271 and series 12, images 133 and 131. On the right the high-grade stenosis is proximal to the right PICA origin which remains patent on series 10, image 260. Both vertebral arteries do remain patent to the vertebrobasilar junction, and this despite a 2nd level of tandem string sign stenosis of the distal left V4 segment just proximal to the vertebrobasilar junction (series 12, image 122. Furthermore, there is a small 2 millimeter saccular aneurysm versus atherosclerotic pseudo-lesion of the left V4 segment seen on series 12, image 129. The left AICA appears to be dominant and  patent. Patent basilar artery with mild irregularity but no stenosis. Patent SCA and PCA origins. Small posterior communicating arteries. Moderate to severe left PCA P2 segment stenosis on series 16, image 21. Preserved left PCA branch enhancement. Mild to moderate contralateral right P2 stenosis (series 14, image 20) also with preserved distal enhancement. Anterior circulation: Both ICA siphons are patent. On the left there is moderate to severe calcified plaque and moderate to severe supraclinoid stenosis on series 10, image 219. On the right side there is similar extensive calcified plaque, with moderate to severe cavernous and severe supraclinoid right ICA stenoses (series 10, image 220). Normal ophthalmic and posterior communicating artery origins. Patent carotid termini. MCA and ACA origins are within normal limits. There is bilateral A1 segment irregularity without stenosis. Diminutive anterior communicating artery. Bilateral ACA branches are within normal limits. There is moderate irregularity in the left MCA M1 segment without stenosis. Left MCA bifurcation is patent. Left MCA branches appear patent with mild stenosis. On the right there is moderate to severe irregularity and at least moderate stenosis of the right MCA M1 segment (series 14, images 20 and 21). The right MCA bifurcation is patent but irregular  with moderate to severe proximal M2 stenosis on series 15, image 18. However, no right MCA branch occlusion is identified. Venous sinuses: Patent. Anatomic variants: None. Review of the MIP images confirms the above findings IMPRESSION: 1. Negative for large vessel occlusion, but positive for high-grade RADIOGRAPHIC STRING SIGN stenoses due to bulky calcified plaque of: - Left vertebral artery V4 segment (tandem string sign stenoses). - Right Vertebral artery V4 segment. - Right ICA supraclinoid segment. 2. Additionally, there are moderate to severe atherosclerotic stenoses of: - Left ICA supraclinoid  segment. - Right ICA cavernous segment. - Right MCA M1 segment and MCA bifurcation. - Left PCA P2 segment. 3. Furthermore, there is a small 2 mm saccular aneurysm versus atherosclerotic pseudo-lesion of the left vertebral V4 segment (series 12, image 129). 4. No significant Arterial stenosis in the neck despite atherosclerosis. 5.  Stable CT appearance of the brain. Electronically Signed   By: Odessa FlemingH  Hall M.D.   On: 12/14/2018 01:02   Ct Head Wo Contrast  Result Date: 12/13/2018 CLINICAL DATA:  Dizziness, nausea, vomiting EXAM: CT HEAD WITHOUT CONTRAST TECHNIQUE: Contiguous axial images were obtained from the base of the skull through the vertex without intravenous contrast. COMPARISON:  None. FINDINGS: Brain: There is atrophy and chronic small vessel disease changes. No acute intracranial abnormality. Specifically, no hemorrhage, hydrocephalus, mass lesion, acute infarction, or significant intracranial injury. Vascular: No hyperdense vessel or unexpected calcification. Skull: No acute calvarial abnormality. Sinuses/Orbits: Visualized paranasal sinuses and mastoids clear. Orbital soft tissues unremarkable. Other: None IMPRESSION: Atrophy, chronic microvascular disease. No acute intracranial abnormality. Electronically Signed   By: Charlett NoseKevin  Dover M.D.   On: 12/13/2018 19:23   Ct Angio Neck W And/or Wo Contrast  Result Date: 12/14/2018 CLINICAL DATA:  68 year old male with sudden onset dizziness with no acute infarct but heavily calcified ectatic distal vertebral arteries on head CT and MRI earlier today. EXAM: CT ANGIOGRAPHY HEAD AND NECK TECHNIQUE: Multidetector CT imaging of the head and neck was performed using the standard protocol during bolus administration of intravenous contrast. Multiplanar CT image reconstructions and MIPs were obtained to evaluate the vascular anatomy. Carotid stenosis measurements (when applicable) are obtained utilizing NASCET criteria, using the distal internal carotid diameter as  the denominator. CONTRAST:  75mL OMNIPAQUE IOHEXOL 350 MG/ML SOLN COMPARISON:  brain MRI and head CT earlier tonight. FINDINGS: CT HEAD Brain: Stable gray-white matter differentiation throughout the brain. No acute intracranial abnormality. Calvarium and skull base: No acute osseous abnormality identified. Paranasal sinuses: Visualized paranasal sinuses and mastoids are stable and well pneumatized. Orbits: Visualized orbits and scalp soft tissues are within normal limits. CTA NECK Skeleton: Carious left mandible dentition. No acute osseous abnormality identified. Upper chest: Negative visible upper lungs aside from mild atelectasis. Mild mediastinal lipomatosis. No superior mediastinal lymphadenopathy. Other neck: No acute findings. Aortic arch: Calcified aortic atherosclerosis. 3 vessel arch configuration. Right carotid system: Tortuous brachiocephalic artery with mild plaque and no stenosis. Tortuous proximal right carotid artery with a kinked appearance at the thoracic inlet but no other stenosis. Soft and calcified plaque at the right ICA origin and bulb with no associated stenosis. Tortuous right ICA just below the skull base. Left carotid system: Mildly tortuous proximal left CCA without stenosis. Partially retropharyngeal course. Calcified plaque at the left ICA origin resulting in less than 50 % stenosis with respect to the distal vessel. Tortuous left ICA just below the skull base. Vertebral arteries: Tortuous proximal right subclavian artery without stenosis. Normal right vertebral artery origin on series 10,  image 537. Patent right vertebral artery to the skull base with tortuosity but no stenosis. Mild plaque in the proximal left subclavian artery without stenosis. Normal left vertebral artery origin. Tortuous left V1 segment with a kinked appearance but no other stenosis. Patent left vertebral artery to the skull base with mild tortuosity, no stenosis. CTA HEAD Posterior circulation: Heavily calcified  bilateral vertebral artery V4 segments with radiographic string sign stenosis as seen on series 10, images 274, 271 and series 12, images 133 and 131. On the right the high-grade stenosis is proximal to the right PICA origin which remains patent on series 10, image 260. Both vertebral arteries do remain patent to the vertebrobasilar junction, and this despite a 2nd level of tandem string sign stenosis of the distal left V4 segment just proximal to the vertebrobasilar junction (series 12, image 122. Furthermore, there is a small 2 millimeter saccular aneurysm versus atherosclerotic pseudo-lesion of the left V4 segment seen on series 12, image 129. The left AICA appears to be dominant and patent. Patent basilar artery with mild irregularity but no stenosis. Patent SCA and PCA origins. Small posterior communicating arteries. Moderate to severe left PCA P2 segment stenosis on series 16, image 21. Preserved left PCA branch enhancement. Mild to moderate contralateral right P2 stenosis (series 14, image 20) also with preserved distal enhancement. Anterior circulation: Both ICA siphons are patent. On the left there is moderate to severe calcified plaque and moderate to severe supraclinoid stenosis on series 10, image 219. On the right side there is similar extensive calcified plaque, with moderate to severe cavernous and severe supraclinoid right ICA stenoses (series 10, image 220). Normal ophthalmic and posterior communicating artery origins. Patent carotid termini. MCA and ACA origins are within normal limits. There is bilateral A1 segment irregularity without stenosis. Diminutive anterior communicating artery. Bilateral ACA branches are within normal limits. There is moderate irregularity in the left MCA M1 segment without stenosis. Left MCA bifurcation is patent. Left MCA branches appear patent with mild stenosis. On the right there is moderate to severe irregularity and at least moderate stenosis of the right MCA M1  segment (series 14, images 20 and 21). The right MCA bifurcation is patent but irregular with moderate to severe proximal M2 stenosis on series 15, image 18. However, no right MCA branch occlusion is identified. Venous sinuses: Patent. Anatomic variants: None. Review of the MIP images confirms the above findings IMPRESSION: 1. Negative for large vessel occlusion, but positive for high-grade RADIOGRAPHIC STRING SIGN stenoses due to bulky calcified plaque of: - Left vertebral artery V4 segment (tandem string sign stenoses). - Right Vertebral artery V4 segment. - Right ICA supraclinoid segment. 2. Additionally, there are moderate to severe atherosclerotic stenoses of: - Left ICA supraclinoid segment. - Right ICA cavernous segment. - Right MCA M1 segment and MCA bifurcation. - Left PCA P2 segment. 3. Furthermore, there is a small 2 mm saccular aneurysm versus atherosclerotic pseudo-lesion of the left vertebral V4 segment (series 12, image 129). 4. No significant Arterial stenosis in the neck despite atherosclerosis. 5.  Stable CT appearance of the brain. Electronically Signed   By: Genevie Ann M.D.   On: 12/14/2018 01:02   Mr Brain Wo Contrast  Result Date: 12/13/2018 CLINICAL DATA:  68 year old male with sudden onset dizziness, nausea vomiting. EXAM: MRI HEAD WITHOUT CONTRAST TECHNIQUE: Multiplanar, multiecho pulse sequences of the brain and surrounding structures were obtained without intravenous contrast. COMPARISON:  Head CT earlier tonight. FINDINGS: Brain: No restricted diffusion to suggest acute  infarction. No midline shift, mass effect, evidence of mass lesion, ventriculomegaly, extra-axial collection or acute intracranial hemorrhage. Cervicomedullary junction and pituitary are within normal limits. Patchy and confluent bilateral cerebral white matter T2 and FLAIR hyperintensity in a nonspecific configuration. No chronic cerebral blood products or cortical encephalomalacia identified. Minimal T2 heterogeneity  in the deep gray matter nuclei. Brainstem and cerebellum appear normal. Vascular: Major intracranial vascular flow voids are preserved. Dolichoectatic distal vertebral arteries, heavily calcified on the CT earlier today. Skull and upper cervical spine: Negative visible cervical spine. Visualized bone marrow signal is within normal limits. Sinuses/Orbits: Negative orbits. Paranasal sinuses are clear. Other: Mastoids are clear. Visible internal auditory structures appear normal. Stylomastoid foramina appear within normal limits. Scalp and face soft tissues appear negative. IMPRESSION: 1. Negative for acute infarct or acute intracranial abnormality. 2. Heavily calcified and dolichoectatic distal vertebral arteries. There is no evidence of a prior posterior circulation infarct, but consider the possibility of vertebrobasilar insufficiency in this clinical setting. 3. Moderate for age nonspecific cerebral white matter signal changes, most commonly due to chronic small vessel disease. Electronically Signed   By: Odessa Fleming M.D.   On: 12/13/2018 20:46        Scheduled Meds:   stroke: mapping our early stages of recovery book   Does not apply Once   [START ON 12/16/2018] amLODipine  10 mg Oral Daily   aspirin EC  81 mg Oral Daily   clopidogrel  75 mg Oral Daily   heparin  5,000 Units Subcutaneous Q8H   labetalol  100 mg Oral BID   rosuvastatin  40 mg Oral q1800   Continuous Infusions:   LOS: 1 day    Time spent: 35 minutes    Tresa Moore, MD [facility] Pager [phone]  If 7PM-7AM, please contact night-coverage [email or web address] Password [email or web address] 12/15/2018, 1:38 PM

## 2018-12-16 MED ORDER — ASPIRIN 81 MG PO TBEC: 81.0000 mg | DELAYED_RELEASE_TABLET | Freq: Every day | ORAL | 0 refills | Status: AC

## 2018-12-16 MED ORDER — CLOPIDOGREL BISULFATE 75 MG PO TABS: 75.0000 mg | ORAL_TABLET | Freq: Every day | ORAL | 1 refills | Status: AC

## 2018-12-16 MED ORDER — AMLODIPINE BESYLATE 10 MG PO TABS: 10.0000 mg | ORAL_TABLET | Freq: Every day | ORAL | 1 refills | Status: AC

## 2018-12-16 MED ORDER — ROSUVASTATIN CALCIUM 40 MG PO TABS: 40.0000 mg | ORAL_TABLET | Freq: Every day | ORAL | 1 refills | Status: AC

## 2018-12-16 MED ORDER — CARVEDILOL 12.5 MG PO TABS: 12.5000 mg | ORAL_TABLET | Freq: Two times a day (BID) | ORAL | 11 refills | Status: AC

## 2018-12-16 NOTE — Plan of Care (Signed)
°  Problem: Clinical Measurements: °Goal: Cardiovascular complication will be avoided °Outcome: Progressing °  °Problem: Activity: °Goal: Risk for activity intolerance will decrease °Outcome: Progressing °  °

## 2018-12-16 NOTE — Progress Notes (Signed)
Patient discharged to home.  Tele and IV d/c'd prior to discharge. Patient verbalizes understanding of discharge instructions. 

## 2018-12-16 NOTE — Discharge Summary (Signed)
Physician Discharge Summary  Albert Leon UMP:[id number] DOB: 09-19-1950 DOA: 12/13/2018  PCP: Patient, No Pcp Per  Admit date: 12/13/2018 Discharge date: 12/16/2018  Admitted From: Home Disposition:  Home  Recommendations for Outpatient Follow-up:  1. Follow up with PCP in 1-2 weeks 2. Follow up with neurology in 2 weeks   Home Health:NO  Equipment/Devices:None  Discharge Condition:Stable  CODE STATUS:FULL Diet recommendation: Regular  Brief/Interim Summary: Per HPI: Albert Leon is a 68 y.o. male with medical history significant of  Personality disorder/depression who presents to the ED with complaints of refractory acute vertigo.  Patient states that he was on his way home when he acutely became very dizzy stated that he was unable to stand upright and had to immediately sit.  He states that symptoms continue to persist and was associated with nausea but no emesis.  He states a passerby assisted him in calling EMS.  He stated that prior to the onset of dizziness he had been in his normal state of health.  He denies any recent URI signs and symptoms any fever chills shortness of breath or chest pain.  He denies any associated vision changes difficulty with swallowing focal weakness or paresthesias associated with these new symptoms.  Notes he is not had symptoms like this in the past.  ED Course:  In ED patient was slated as a code stroke his initial CT head was noted to be negative MRI of the head was also noted to be negative however there was significant calcification in the raise of the posterior circulation.  Evaluation by telemetry neuro due to continued symptoms noted right-sided nystagmus and diplopia there was concern that patient possibly had a posterior circulation event that was causing his current symptoms.  Telemetry neurology recommended admission for continued stroke evaluation.  It was also recommended to start patient on aspirin low-dose as well as Plavix in  addition to obtaining a CTA to further evaluate posterior circulation. Pertinent laboratory data noted and negative urine drug tox urinalysis was also noted to be negative troponins also negative White count noted to be 11  however no noted signs of infection.  Hospital course: Patient was admitted to the hospital under the hospitalist service.  Neurology consulted and followed throughout the course of admission.  Per neurology recommendations patient was started on aspirin and Plavix dual antiplatelet therapy was continued throughout the course of admission.  CTA was performed per neurology recommendations.  Significant for small vessel ischemic disease.  Per neurology recommendation vascular surgery was consulted.  No operative intervention is recommended at this time.  Patient will be maintained on dual antiplatelet therapy as well as lipid-lowering therapy for risk factor modification.  Also of note the patient has not seen a doctor in 10 years and had markedly elevated labile blood pressures.  After initiation of 2 agents amlodipine and labetalol the patient's blood pressure control improved.  Per patient request at the request of a medical family member of his we will switch to labetalol to carvedilol at time of discharge.  I have stressed to the patient importance good primary care follow-up.  He states that his sister made him primary care appointment with the same physician that saw him over 10 years ago.  I also instructed him to follow-up with a neurologist that he saw here in the hospital.  Patient expressed understanding of all post discharge instructions.  Patient be discharged home in stable condition.    Discharge Diagnoses:  Active Problems:   TIA (  transient ischemic attack)   Vertebral artery narrowing, bilateral   Dizziness    Discharge Instructions  Discharge Instructions    Ambulatory referral to Vascular Surgery   Complete by: As directed    Diet - low sodium heart healthy    Complete by: As directed    Discharge instructions   Complete by: As directed    Please take medications as prescribed.  Make sure you see your primary care physician as soon as possible   Increase activity slowly   Complete by: As directed      Allergies as of 12/16/2018   No Known Allergies     Medication List    STOP taking these medications   naproxen 500 MG tablet Commonly known as: NAPROSYN     TAKE these medications   amLODipine 10 MG tablet Commonly known as: NORVASC Take 1 tablet (10 mg total) by mouth daily. Start taking on: December 17, 2018   aspirin 81 MG EC tablet Take 1 tablet (81 mg total) by mouth daily. Start taking on: December 17, 2018   carvedilol 12.5 MG tablet Commonly known as: Coreg Take 1 tablet (12.5 mg total) by mouth 2 (two) times daily.   clopidogrel 75 MG tablet Commonly known as: PLAVIX Take 1 tablet (75 mg total) by mouth daily. Start taking on: December 17, 2018   rosuvastatin 40 MG tablet Commonly known as: CRESTOR Take 1 tablet (40 mg total) by mouth daily.      Follow-up Information    Dew, Marlow Baars, MD. Go on 03/15/2019.   Specialties: Vascular Surgery, Radiology, Interventional Cardiology Why: Discuss recent CTA Head / Neck. Seen as consult. ......Marland Kitchenappointment at 11:45am Contact information: [address] [place] [address] [phone]          No Known Allergies  Consultations:  Neurology- Thad Ranger  Vascular surgery- Dew   Procedures/Studies: Ct Angio Head W Or Wo Contrast  Result Date: 12/14/2018 CLINICAL DATA:  68 year old male with sudden onset dizziness with no acute infarct but heavily calcified ectatic distal vertebral arteries on head CT and MRI earlier today. EXAM: CT ANGIOGRAPHY HEAD AND NECK TECHNIQUE: Multidetector CT imaging of the head and neck was performed using the standard protocol during bolus administration of intravenous contrast. Multiplanar CT image reconstructions and MIPs were  obtained to evaluate the vascular anatomy. Carotid stenosis measurements (when applicable) are obtained utilizing NASCET criteria, using the distal internal carotid diameter as the denominator. CONTRAST:  75mL OMNIPAQUE IOHEXOL 350 MG/ML SOLN COMPARISON:  brain MRI and head CT earlier tonight. FINDINGS: CT HEAD Brain: Stable gray-white matter differentiation throughout the brain. No acute intracranial abnormality. Calvarium and skull base: No acute osseous abnormality identified. Paranasal sinuses: Visualized paranasal sinuses and mastoids are stable and well pneumatized. Orbits: Visualized orbits and scalp soft tissues are within normal limits. CTA NECK Skeleton: Carious left mandible dentition. No acute osseous abnormality identified. Upper chest: Negative visible upper lungs aside from mild atelectasis. Mild mediastinal lipomatosis. No superior mediastinal lymphadenopathy. Other neck: No acute findings. Aortic arch: Calcified aortic atherosclerosis. 3 vessel arch configuration. Right carotid system: Tortuous brachiocephalic artery with mild plaque and no stenosis. Tortuous proximal right carotid artery with a kinked appearance at the thoracic inlet but no other stenosis. Soft and calcified plaque at the right ICA origin and bulb with no associated stenosis. Tortuous right ICA just below the skull base. Left carotid system: Mildly tortuous proximal left CCA without stenosis. Partially retropharyngeal course. Calcified plaque at the left ICA origin resulting in  less than 50 % stenosis with respect to the distal vessel. Tortuous left ICA just below the skull base. Vertebral arteries: Tortuous proximal right subclavian artery without stenosis. Normal right vertebral artery origin on series 10, image 537. Patent right vertebral artery to the skull base with tortuosity but no stenosis. Mild plaque in the proximal left subclavian artery without stenosis. Normal left vertebral artery origin. Tortuous left V1 segment with  a kinked appearance but no other stenosis. Patent left vertebral artery to the skull base with mild tortuosity, no stenosis. CTA HEAD Posterior circulation: Heavily calcified bilateral vertebral artery V4 segments with radiographic string sign stenosis as seen on series 10, images 274, 271 and series 12, images 133 and 131. On the right the high-grade stenosis is proximal to the right PICA origin which remains patent on series 10, image 260. Both vertebral arteries do remain patent to the vertebrobasilar junction, and this despite a 2nd level of tandem string sign stenosis of the distal left V4 segment just proximal to the vertebrobasilar junction (series 12, image 122. Furthermore, there is a small 2 millimeter saccular aneurysm versus atherosclerotic pseudo-lesion of the left V4 segment seen on series 12, image 129. The left AICA appears to be dominant and patent. Patent basilar artery with mild irregularity but no stenosis. Patent SCA and PCA origins. Small posterior communicating arteries. Moderate to severe left PCA P2 segment stenosis on series 16, image 21. Preserved left PCA branch enhancement. Mild to moderate contralateral right P2 stenosis (series 14, image 20) also with preserved distal enhancement. Anterior circulation: Both ICA siphons are patent. On the left there is moderate to severe calcified plaque and moderate to severe supraclinoid stenosis on series 10, image 219. On the right side there is similar extensive calcified plaque, with moderate to severe cavernous and severe supraclinoid right ICA stenoses (series 10, image 220). Normal ophthalmic and posterior communicating artery origins. Patent carotid termini. MCA and ACA origins are within normal limits. There is bilateral A1 segment irregularity without stenosis. Diminutive anterior communicating artery. Bilateral ACA branches are within normal limits. There is moderate irregularity in the left MCA M1 segment without stenosis. Left MCA  bifurcation is patent. Left MCA branches appear patent with mild stenosis. On the right there is moderate to severe irregularity and at least moderate stenosis of the right MCA M1 segment (series 14, images 20 and 21). The right MCA bifurcation is patent but irregular with moderate to severe proximal M2 stenosis on series 15, image 18. However, no right MCA branch occlusion is identified. Venous sinuses: Patent. Anatomic variants: None. Review of the MIP images confirms the above findings IMPRESSION: 1. Negative for large vessel occlusion, but positive for high-grade RADIOGRAPHIC STRING SIGN stenoses due to bulky calcified plaque of: - Left vertebral artery V4 segment (tandem string sign stenoses). - Right Vertebral artery V4 segment. - Right ICA supraclinoid segment. 2. Additionally, there are moderate to severe atherosclerotic stenoses of: - Left ICA supraclinoid segment. - Right ICA cavernous segment. - Right MCA M1 segment and MCA bifurcation. - Left PCA P2 segment. 3. Furthermore, there is a small 2 mm saccular aneurysm versus atherosclerotic pseudo-lesion of the left vertebral V4 segment (series 12, image 129). 4. No significant Arterial stenosis in the neck despite atherosclerosis. 5.  Stable CT appearance of the brain. Electronically Signed   By: Odessa FlemingH  Hall M.D.   On: 12/14/2018 01:02   Ct Head Wo Contrast  Result Date: 12/13/2018 CLINICAL DATA:  Dizziness, nausea, vomiting EXAM: CT HEAD WITHOUT  CONTRAST TECHNIQUE: Contiguous axial images were obtained from the base of the skull through the vertex without intravenous contrast. COMPARISON:  None. FINDINGS: Brain: There is atrophy and chronic small vessel disease changes. No acute intracranial abnormality. Specifically, no hemorrhage, hydrocephalus, mass lesion, acute infarction, or significant intracranial injury. Vascular: No hyperdense vessel or unexpected calcification. Skull: No acute calvarial abnormality. Sinuses/Orbits: Visualized paranasal sinuses  and mastoids clear. Orbital soft tissues unremarkable. Other: None IMPRESSION: Atrophy, chronic microvascular disease. No acute intracranial abnormality. Electronically Signed   By: Charlett Nose M.D.   On: 12/13/2018 19:23   Ct Angio Neck W And/or Wo Contrast  Result Date: 12/14/2018 CLINICAL DATA:  68 year old male with sudden onset dizziness with no acute infarct but heavily calcified ectatic distal vertebral arteries on head CT and MRI earlier today. EXAM: CT ANGIOGRAPHY HEAD AND NECK TECHNIQUE: Multidetector CT imaging of the head and neck was performed using the standard protocol during bolus administration of intravenous contrast. Multiplanar CT image reconstructions and MIPs were obtained to evaluate the vascular anatomy. Carotid stenosis measurements (when applicable) are obtained utilizing NASCET criteria, using the distal internal carotid diameter as the denominator. CONTRAST:  75mL OMNIPAQUE IOHEXOL 350 MG/ML SOLN COMPARISON:  brain MRI and head CT earlier tonight. FINDINGS: CT HEAD Brain: Stable gray-white matter differentiation throughout the brain. No acute intracranial abnormality. Calvarium and skull base: No acute osseous abnormality identified. Paranasal sinuses: Visualized paranasal sinuses and mastoids are stable and well pneumatized. Orbits: Visualized orbits and scalp soft tissues are within normal limits. CTA NECK Skeleton: Carious left mandible dentition. No acute osseous abnormality identified. Upper chest: Negative visible upper lungs aside from mild atelectasis. Mild mediastinal lipomatosis. No superior mediastinal lymphadenopathy. Other neck: No acute findings. Aortic arch: Calcified aortic atherosclerosis. 3 vessel arch configuration. Right carotid system: Tortuous brachiocephalic artery with mild plaque and no stenosis. Tortuous proximal right carotid artery with a kinked appearance at the thoracic inlet but no other stenosis. Soft and calcified plaque at the right ICA origin and  bulb with no associated stenosis. Tortuous right ICA just below the skull base. Left carotid system: Mildly tortuous proximal left CCA without stenosis. Partially retropharyngeal course. Calcified plaque at the left ICA origin resulting in less than 50 % stenosis with respect to the distal vessel. Tortuous left ICA just below the skull base. Vertebral arteries: Tortuous proximal right subclavian artery without stenosis. Normal right vertebral artery origin on series 10, image 537. Patent right vertebral artery to the skull base with tortuosity but no stenosis. Mild plaque in the proximal left subclavian artery without stenosis. Normal left vertebral artery origin. Tortuous left V1 segment with a kinked appearance but no other stenosis. Patent left vertebral artery to the skull base with mild tortuosity, no stenosis. CTA HEAD Posterior circulation: Heavily calcified bilateral vertebral artery V4 segments with radiographic string sign stenosis as seen on series 10, images 274, 271 and series 12, images 133 and 131. On the right the high-grade stenosis is proximal to the right PICA origin which remains patent on series 10, image 260. Both vertebral arteries do remain patent to the vertebrobasilar junction, and this despite a 2nd level of tandem string sign stenosis of the distal left V4 segment just proximal to the vertebrobasilar junction (series 12, image 122. Furthermore, there is a small 2 millimeter saccular aneurysm versus atherosclerotic pseudo-lesion of the left V4 segment seen on series 12, image 129. The left AICA appears to be dominant and patent. Patent basilar artery with mild irregularity but no stenosis.  Patent SCA and PCA origins. Small posterior communicating arteries. Moderate to severe left PCA P2 segment stenosis on series 16, image 21. Preserved left PCA branch enhancement. Mild to moderate contralateral right P2 stenosis (series 14, image 20) also with preserved distal enhancement. Anterior  circulation: Both ICA siphons are patent. On the left there is moderate to severe calcified plaque and moderate to severe supraclinoid stenosis on series 10, image 219. On the right side there is similar extensive calcified plaque, with moderate to severe cavernous and severe supraclinoid right ICA stenoses (series 10, image 220). Normal ophthalmic and posterior communicating artery origins. Patent carotid termini. MCA and ACA origins are within normal limits. There is bilateral A1 segment irregularity without stenosis. Diminutive anterior communicating artery. Bilateral ACA branches are within normal limits. There is moderate irregularity in the left MCA M1 segment without stenosis. Left MCA bifurcation is patent. Left MCA branches appear patent with mild stenosis. On the right there is moderate to severe irregularity and at least moderate stenosis of the right MCA M1 segment (series 14, images 20 and 21). The right MCA bifurcation is patent but irregular with moderate to severe proximal M2 stenosis on series 15, image 18. However, no right MCA branch occlusion is identified. Venous sinuses: Patent. Anatomic variants: None. Review of the MIP images confirms the above findings IMPRESSION: 1. Negative for large vessel occlusion, but positive for high-grade RADIOGRAPHIC STRING SIGN stenoses due to bulky calcified plaque of: - Left vertebral artery V4 segment (tandem string sign stenoses). - Right Vertebral artery V4 segment. - Right ICA supraclinoid segment. 2. Additionally, there are moderate to severe atherosclerotic stenoses of: - Left ICA supraclinoid segment. - Right ICA cavernous segment. - Right MCA M1 segment and MCA bifurcation. - Left PCA P2 segment. 3. Furthermore, there is a small 2 mm saccular aneurysm versus atherosclerotic pseudo-lesion of the left vertebral V4 segment (series 12, image 129). 4. No significant Arterial stenosis in the neck despite atherosclerosis. 5.  Stable CT appearance of the brain.  Electronically Signed   By: Odessa Fleming M.D.   On: 12/14/2018 01:02   Mr Brain Wo Contrast  Result Date: 12/13/2018 CLINICAL DATA:  68 year old male with sudden onset dizziness, nausea vomiting. EXAM: MRI HEAD WITHOUT CONTRAST TECHNIQUE: Multiplanar, multiecho pulse sequences of the brain and surrounding structures were obtained without intravenous contrast. COMPARISON:  Head CT earlier tonight. FINDINGS: Brain: No restricted diffusion to suggest acute infarction. No midline shift, mass effect, evidence of mass lesion, ventriculomegaly, extra-axial collection or acute intracranial hemorrhage. Cervicomedullary junction and pituitary are within normal limits. Patchy and confluent bilateral cerebral white matter T2 and FLAIR hyperintensity in a nonspecific configuration. No chronic cerebral blood products or cortical encephalomalacia identified. Minimal T2 heterogeneity in the deep gray matter nuclei. Brainstem and cerebellum appear normal. Vascular: Major intracranial vascular flow voids are preserved. Dolichoectatic distal vertebral arteries, heavily calcified on the CT earlier today. Skull and upper cervical spine: Negative visible cervical spine. Visualized bone marrow signal is within normal limits. Sinuses/Orbits: Negative orbits. Paranasal sinuses are clear. Other: Mastoids are clear. Visible internal auditory structures appear normal. Stylomastoid foramina appear within normal limits. Scalp and face soft tissues appear negative. IMPRESSION: 1. Negative for acute infarct or acute intracranial abnormality. 2. Heavily calcified and dolichoectatic distal vertebral arteries. There is no evidence of a prior posterior circulation infarct, but consider the possibility of vertebrobasilar insufficiency in this clinical setting. 3. Moderate for age nonspecific cerebral white matter signal changes, most commonly due to chronic small vessel  disease. Electronically Signed   By: Odessa Fleming M.D.   On: 12/13/2018 20:46        Subjective:   Discharge Exam: Vitals:   12/16/18 0323 12/16/18 0807  BP: (!) 143/86 (!) 148/89  Pulse: 63 78  Resp: 18   Temp: 97.8 F (36.6 C) 98.3 F (36.8 C)  SpO2: 96% 95%   Vitals:   12/15/18 1447 12/15/18 2110 12/16/18 0323 12/16/18 0807  BP: 133/74 127/83 (!) 143/86 (!) 148/89  Pulse: (!) 58 72 63 78  Resp: Temp: 98.3 F (36.8 C) 97.8 F (36.6 C) 97.8 F (36.6 C) 98.3 F (36.8 C)  TempSrc: Oral Oral Oral Oral  SpO2: 95% 96% 96% 95%  Weight:   84.4 kg   Height:        General: Pt is alert, awake, not in acute distress Cardiovascular: RRR, S1/S2 +, no rubs, no gallops Respiratory: CTA bilaterally, no wheezing, no rhonchi Abdominal: Soft, NT, ND, bowel sounds + Extremities: no edema, no cyanosis    The results of significant diagnostics from this hospitalization (including imaging, microbiology, ancillary and laboratory) are listed below for reference.     Microbiology: Recent Results (from the past 240 hour(s))  SARS CORONAVIRUS 2 (TAT 6-24 HRS) Nasopharyngeal Nasopharyngeal Swab     Status: None   Collection Time: 12/13/18 11:41 PM   Specimen: Nasopharyngeal Swab  Result Value Ref Range Status   SARS Coronavirus 2 NEGATIVE NEGATIVE Final    Comment: (NOTE) SARS-CoV-2 target nucleic acids are NOT DETECTED. The SARS-CoV-2 RNA is generally detectable in upper and lower respiratory specimens during the acute phase of infection. Negative results do not preclude SARS-CoV-2 infection, do not rule out co-infections with other pathogens, and should not be used as the sole basis for treatment or other patient management decisions. Negative results must be combined with clinical observations, patient history, and epidemiological information. The expected result is Negative. Fact Sheet for Patients: HairSlick.no Fact Sheet for Healthcare Providers: quierodirigir.com This test is not yet  approved or cleared by the [place] FDA and  has been authorized for detection and/or diagnosis of SARS-CoV-2 by FDA under an Emergency Use Authorization (EUA). This EUA will remain  in effect (meaning this test can be used) for the duration of the COVID-19 declaration under Section 56 4(b)(1) of the Act, 21 U.S.C. section 360bbb-3(b)(1), unless the authorization is terminated or revoked sooner. Performed at [facility], [address] [place], [place] [address]      Labs: BNP (last 3 results) No results for input(s): BNP in the last 8760 hours. Basic Metabolic Panel: Recent Labs  Lab 12/13/18 1647 12/14/18 0643  NA 141  --   K 3.7  --   CL 106  --   CO2 23  --   GLUCOSE 145*  --   BUN 25*  --   CREATININE 1.03 0.71  CALCIUM 9.7  --    Liver Function Tests: Recent Labs  Lab 12/13/18 1647  AST 27  ALT 24  ALKPHOS 71  BILITOT 0.6  PROT 8.3*  ALBUMIN 4.7   No results for input(s): LIPASE, AMYLASE in the last 168 hours. No results for input(s): AMMONIA in the last 168 hours. CBC: Recent Labs  Lab 12/13/18 1647 12/14/18 0643  WBC 11.0* 8.8  HGB 15.5 14.0  HCT 44.3 40.5  MCV 87.7 87.7  PLT 304 279   Cardiac Enzymes: No results for input(s): CKTOTAL, CKMB, CKMBINDEX, TROPONINI in  the last 168 hours. BNP: Invalid input(s): POCBNP CBG: No results for input(s): GLUCAP in the last 168 hours. D-Dimer No results for input(s): DDIMER in the last 72 hours. Hgb A1c Recent Labs    12/14/18 0643  HGBA1C 5.2   Lipid Profile Recent Labs    12/14/18 0643  CHOL 227*  HDL 45  LDLCALC 167*  TRIG 75  CHOLHDL 5.0   Thyroid function studies No results for input(s): TSH, T4TOTAL, T3FREE, THYROIDAB in the last 72 hours.  Invalid input(s): FREET3 Anemia work up No results for input(s): VITAMINB12, FOLATE, FERRITIN, TIBC, IRON, RETICCTPCT in the last 72 hours. Urinalysis    Component Value Date/Time   COLORURINE YELLOW (A) 12/13/2018 2341    APPEARANCEUR CLEAR (A) 12/13/2018 2341   LABSPEC 1.018 12/13/2018 2341   PHURINE 6.0 12/13/2018 2341   GLUCOSEU NEGATIVE 12/13/2018 2341   HGBUR SMALL (A) 12/13/2018 2341   BILIRUBINUR NEGATIVE 12/13/2018 2341   KETONESUR 20 (A) 12/13/2018 2341   PROTEINUR NEGATIVE 12/13/2018 2341   NITRITE NEGATIVE 12/13/2018 2341   LEUKOCYTESUR NEGATIVE 12/13/2018 2341   Sepsis Labs Invalid input(s): PROCALCITONIN,  WBC,  LACTICIDVEN Microbiology Recent Results (from the past 240 hour(s))  SARS CORONAVIRUS 2 (TAT 6-24 HRS) Nasopharyngeal Nasopharyngeal Swab     Status: None   Collection Time: 12/13/18 11:41 PM   Specimen: Nasopharyngeal Swab  Result Value Ref Range Status   SARS Coronavirus 2 NEGATIVE NEGATIVE Final    Comment: (NOTE) SARS-CoV-2 target nucleic acids are NOT DETECTED. The SARS-CoV-2 RNA is generally detectable in upper and lower respiratory specimens during the acute phase of infection. Negative results do not preclude SARS-CoV-2 infection, do not rule out co-infections with other pathogens, and should not be used as the sole basis for treatment or other patient management decisions. Negative results must be combined with clinical observations, patient history, and epidemiological information. The expected result is Negative. Fact Sheet for Patients: SugarRoll.be Fact Sheet for Healthcare Providers: https://www.woods-mathews.com/ This test is not yet approved or cleared by the [place] FDA and  has been authorized for detection and/or diagnosis of SARS-CoV-2 by FDA under an Emergency Use Authorization (EUA). This EUA will remain  in effect (meaning this test can be used) for the duration of the COVID-19 declaration under Section 56 4(b)(1) of the Act, 21 U.S.C. section 360bbb-3(b)(1), unless the authorization is terminated or revoked sooner. Performed at [facility], [place] [address] [place], [place] [address]       Time coordinating discharge: Over 30 minutes  SIGNED:   Sidney Ace, MD  [facility] 12/16/2018, 4:49 PM Pager [phone]  If 7PM-7AM, please contact night-coverage [email or web address] Password [email or web address]

## 2019-03-15 ENCOUNTER — Encounter (INDEPENDENT_AMBULATORY_CARE_PROVIDER_SITE_OTHER): Payer: Self-pay | Admitting: Vascular Surgery

## 2019-03-15 ENCOUNTER — Ambulatory Visit (INDEPENDENT_AMBULATORY_CARE_PROVIDER_SITE_OTHER): Payer: Medicare HMO | Admitting: Vascular Surgery

## 2019-03-15 ENCOUNTER — Other Ambulatory Visit: Payer: Self-pay

## 2019-03-15 VITALS — BP 178/108 | HR 73 | Resp 16 | Wt 180.6 lb

## 2019-03-15 DIAGNOSIS — I6503 Occlusion and stenosis of bilateral vertebral arteries: Secondary | ICD-10-CM

## 2019-03-15 DIAGNOSIS — I6523 Occlusion and stenosis of bilateral carotid arteries: Secondary | ICD-10-CM | POA: Diagnosis not present

## 2019-03-15 DIAGNOSIS — R42 Dizziness and giddiness: Secondary | ICD-10-CM

## 2019-03-15 DIAGNOSIS — I10 Essential (primary) hypertension: Secondary | ICD-10-CM

## 2019-03-15 DIAGNOSIS — I6529 Occlusion and stenosis of unspecified carotid artery: Secondary | ICD-10-CM | POA: Insufficient documentation

## 2019-03-15 DIAGNOSIS — G459 Transient cerebral ischemic attack, unspecified: Secondary | ICD-10-CM | POA: Diagnosis not present

## 2019-03-15 NOTE — Assessment & Plan Note (Signed)
Likely from his uncontrolled blood pressure although his intracranial disease may be contributing as well.  No role for surgical or interventional therapy at this time.  Recommend blood pressure control, dual antiplatelet therapy, and a statin agent.

## 2019-03-15 NOTE — Patient Instructions (Signed)
Carotid Artery Disease  Carotid artery disease, also called carotid artery stenosis, is the narrowing or blockage of one or both carotid arteries. The carotid arteries are the two main blood vessels on either side of the neck. They supply blood to the brain, other parts of the head, and the neck. Carotid artery disease increases your risk for a stroke or a transient ischemic attack (TIA). A TIA is a "mini-stroke" that causes stroke-like symptoms that then go away quickly. What are the causes? This condition is mainly caused by a narrowing and hardening of the carotid arteries (atherosclerosis). The carotid arteries can become narrow or clogged with a buildup of fat, cholesterol, calcium, and other substances (plaque). What increases the risk? The following factors may make you more likely to develop this condition:  Having certain medical conditions, such as: ? High cholesterol. ? High blood pressure (hypertension). ? Diabetes. ? Obesity.  Smoking.  A family history of cardiovascular disease.  Inactivity or lack of regular exercise.  Being male. Men have an increased risk of developing atherosclerosis earlier in life than women.  Old age. What are the signs or symptoms? This condition may not have any signs or symptoms until a stroke or TIA occurs. In some cases, your health care provider may be able to hear a whooshing sound (bruit). This can indicate a change in blood flow caused by plaque buildup. An eye exam can also help identify signs of the condition. How is this diagnosed? This condition may be diagnosed with a physical exam, your medical history, and your family's medical history. You may also have tests that look at the blood flow in your carotid arteries, such as:  Carotid artery ultrasound, which uses sound waves to create pictures to show if the arteries are narrow or blocked.  Tests that use a dye injected into a vein to highlight your arteries on images, such  as: ? Carotid or cerebral angiography, which uses X-rays. ? Computerized tomographic angiography (CTA), which uses CT scans. ? Magnetic resonance angiography (MRA), which uses MRI. How is this treated? This condition may be treated with a combination of treatments. Treatment options include:  Lifestyle changes, such as: ? Quitting smoking. ? Exercising regularly or as told by your health care provider. ? Eating a heart-healthy diet. ? Managing stress. ? Maintaining a healthy weight.  Medicines to control blood pressure, cholesterol, and blood clotting.  Surgery. You may have: ? A carotid endarterectomy. This is a surgery to remove the blockages in the carotid arteries. ? A carotid angioplasty with stenting. This is a procedure in which a small mesh tube (stent) is used to widen the blocked carotid arteries. Follow these instructions at home: Eating and drinking Follow instructions about your diet from your health care provider. It is important to:  Eat a healthy diet that is low in saturated fats and includes plenty of fresh fruits, vegetables, and lean meats.  Avoid foods that are high in fat and salt (sodium).  Avoid foods that are fried, overly processed, or have poor nutritional value.  Lifestyle   Maintain a healthy weight.  Do exercises as told by your health care provider to stay physically active. It is recommended that each week you get at least 150 minutes of moderate-intensity exercise or 75 minutes of exercise that takes a lot of effort.  Do not use any products that contain nicotine or tobacco, such as cigarettes, e-cigarettes, and chewing tobacco. If you need help quitting, ask your health care provider.    Do not drink alcohol if: ? Your health care provider tells you not to drink. ? You are pregnant, may be pregnant, or are planning to become pregnant.  If you drink alcohol: ? Limit how much you use to:  0-1 drink a day for women.  0-2 drinks a day for  men. ? Be aware of how much alcohol is in your drink. In the [place] one drink equals one 12 oz bottle of beer (355 mL), one 5 oz glass of wine (148 mL), or one 1 oz glass of hard liquor (44 mL).  Do not use drugs.  Manage your stress. Ask your health care provider for stress management tips. General instructions  Take over-the-counter and prescription medicines only as told by your health care provider.  Keep all follow-up visits as told by your health care provider. This is important. Where to find more information  American Heart Association: www.heart.org Get help right away if:  You have any symptoms of a stroke. "BE FAST" is an easy way to remember the main warning signs of a stroke: ? B - Balance. Signs are dizziness, sudden trouble walking, or loss of balance. ? E - Eyes. Signs are trouble seeing or a sudden change in vision. ? F - Face. Signs are sudden weakness or numbness of the face, or the face or eyelid drooping on one side. ? A - Arms. Signs are weakness or numbness in an arm. This happens suddenly and usually on one side of the body. ? S - Speech. Signs are sudden trouble speaking, slurred speech, or trouble understanding what people say. ? T - Time. Time to call emergency services. Write down what time symptoms started.  You have other signs of a stroke, such as: ? A sudden, severe headache with no known cause. ? Nausea or vomiting. ? Seizure. These symptoms may represent a serious problem that is an emergency. Do not wait to see if the symptoms will go away. Get medical help right away. Call your local emergency services (911 in the [place] Do not drive yourself to the hospital. Summary  Carotid artery disease, also called carotid artery stenosis, is the narrowing or blockage of one or both carotid arteries.  Carotid artery disease increases your risk for a stroke or a transient ischemic attack (TIA).  This condition can be treated with lifestyle changes, medicines,  surgery, or a combination of these treatments.  Get help right away if you have any symptoms of stroke. The acronym BEFAST is an easy way to remember the main warning signs of stroke. This information is not intended to replace advice given to you by your health care provider. Make sure you discuss any questions you have with your health care provider. Document Revised: 07/12/2018 Document Reviewed: 07/12/2018 Elsevier Patient Education  2020 Elsevier Inc.  

## 2019-03-15 NOTE — Progress Notes (Signed)
Patient ID: Albert Leon, male   DOB: March 22, 1950, 69 y.o.   MRN: [id number]  Chief Complaint  Patient presents with  . Follow-up    [facility] seen as consult in ED and discuss CTA    HPI Albert Leon is a 69 y.o. male.  Patient follows up after hospitalization about 3 months ago for TIA symptoms with severe dizziness.  He was seen as a consult in the hospital by our service and he was found to have intracranial disease that was quite significant and mild extracranial carotid artery disease as well as vertebral artery disease.  He did well and was discharged home on dual antiplatelet therapy, 2 blood pressure medications, and a statin agent.  He says that he ran out of these medicines after about a month and has not taking them in over a month.  His blood pressure is quite elevated today.  He denies any recurrent TIA or stroke symptoms.  He brings me a list of the medications that he was supposed to take that he is not taking.  When I asked him who his primary care physician was, he answered I guess that it is you now.  He says that he does not like going to doctors and does not have a regular primary care physician.  He has a brother who is a Development worker, community in [place].  He somewhat recently moved here from [place].  His blood pressure is a problem and he says it is always more elevated when he is at the doctors office.    Past Medical History:  Diagnosis Date  . Hypertension     Past Surgical History:  Procedure Laterality Date  . NO PAST SURGERIES       Family History  Problem Relation Age of Onset  . Hypertension Mother   . Hypertension Father   Has a brother who has high blood pressure and is a working Development worker, community in the [place]. No bleeding or clotting disorders   Social History   Tobacco Use  . Smoking status: Never Smoker  . Smokeless tobacco: Never Used  Substance Use Topics  . Alcohol use: No  . Drug use: No     No Known Allergies  Current  Outpatient Medications  Medication Sig Dispense Refill  . amLODipine (NORVASC) 10 MG tablet Take 1 tablet (10 mg total) by mouth daily. 30 tablet 1  . aspirin EC 81 MG EC tablet Take 1 tablet (81 mg total) by mouth daily. 90 tablet 0  . carvedilol (COREG) 12.5 MG tablet Take 1 tablet (12.5 mg total) by mouth 2 (two) times daily. 60 tablet 11  . clopidogrel (PLAVIX) 75 MG tablet Take 1 tablet (75 mg total) by mouth daily. 30 tablet 1  . rosuvastatin (CRESTOR) 40 MG tablet Take 1 tablet (40 mg total) by mouth daily. 30 tablet 1   No current facility-administered medications for this visit.      REVIEW OF SYSTEMS (Negative unless checked)  Constitutional: [] Weight loss  [] Fever  [] Chills Cardiac: [] Chest pain   [] Chest pressure   [] Palpitations   [] Shortness of breath when laying flat   [] Shortness of breath at rest   [] Shortness of breath with exertion. Vascular:  [] Pain in legs with walking   [] Pain in legs at rest   [] Pain in legs when laying flat   [] Claudication   [] Pain in feet when walking  [] Pain in feet at rest  [] Pain in feet when laying flat   []   History of DVT   [] Phlebitis   [] Swelling in legs   [] Varicose veins   [] Non-healing ulcers Pulmonary:   [] Uses home oxygen   [] Productive cough   [] Hemoptysis   [] Wheeze  [] COPD   [] Asthma Neurologic:  [x] Dizziness  [] Blackouts   [] Seizures   [] History of stroke   [x] History of TIA  [] Aphasia   [] Temporary blindness   [] Dysphagia   [] Weakness or numbness in arms   [] Weakness or numbness in legs Musculoskeletal:  [x] Arthritis   [] Joint swelling   [] Joint pain   [] Low back pain Hematologic:  [] Easy bruising  [] Easy bleeding   [] Hypercoagulable state   [] Anemic  [] Hepatitis Gastrointestinal:  [] Blood in stool   [] Vomiting blood  [] Gastroesophageal reflux/heartburn   [] Abdominal pain Genitourinary:  [] Chronic kidney disease   [] Difficult urination  [] Frequent urination  [] Burning with urination   [] Hematuria Skin:  [] Rashes   [] Ulcers    [] Wounds Psychological:  [] History of anxiety   []  History of major depression.    Physical Exam BP (!) 178/108 (BP Location: Left Arm)   Pulse 73   Resp 16   Wt 180 lb 9.6 oz (81.9 kg)   BMI 27.46 kg/m  Gen:  WD/WN, NAD Head: [place]/AT, No temporalis wasting.  Ear/Nose/Throat: Hearing grossly intact, nares w/o erythema or drainage, oropharynx w/o Erythema/Exudate Eyes: Conjunctiva clear, sclera non-icteric  Neck: trachea midline.  No carotid bruits. Pulmonary:  Good air movement, respirations not labored, no use of accessory muscles  Cardiac: RRR, no JVD Vascular:  Vessel Right Left  Radial Palpable Palpable           Musculoskeletal: M/S 5/5 throughout.  Extremities without ischemic changes.  No deformity or atrophy.  No significant lower extremity edema. Neurologic: Sensation grossly intact in extremities.  Symmetrical.  Speech is fluent. Motor exam as listed above. Psychiatric: Judgment intact, Mood & affect appropriate for pt's clinical situation. Dermatologic: No rashes or ulcers noted.  No cellulitis or open wounds.    Radiology No results found.  Labs No results found for this or any previous visit (from the past 2160 hour(s)).  Assessment/Plan:  Dizziness None of significance since his stroke/TIA back about 3 months ago.  TIA (transient ischemic attack) Likely from his uncontrolled blood pressure although his intracranial disease may be contributing as well.  No role for surgical or interventional therapy at this time.  Recommend blood pressure control, dual antiplatelet therapy, and a statin agent.  Vertebral artery narrowing, bilateral No role for surgical or interventional therapy at this time.  Recommend blood pressure control, dual antiplatelet therapy, and a statin agent.  Carotid stenosis The patient has mild extracranial carotid artery stenosis with significant intracranial disease.  No role for intervention for this.  He certainly needs better blood  pressure control and I have refilled his prescription for amlodipine and carvedilol today.  Also would recommend he remain on Plavix and a statin agent and have given him new prescriptions for Crestor and Plavix today.  I will plan a follow-up ultrasound in 3 months for continued evaluation of his carotid disease.  I discussed the pathophysiology and natural history of carotid disease.  I have voiced why it is important and discussed the reason and rationale for continued evaluation.  Essential hypertension The patient's blood pressure is not adequately controlled and he has not been taking his medicines because he ran out.  I have given him a new prescription for amlodipine and carvedilol today, but have told him I am not a primary  care physician and cannot continue to fill these going forward.  I strongly recommend he find a primary care physician will be happy to try to arrange a referral if he has a preference.  He says he wants to look in [place] and see if he can find a doctor there which is certainly reasonable.  I have given him 3 refills so that should hold off until he can establish care with a primary care physician      Leotis Pain 03/15/2019, 11:58 AM   This note was created with Dragon medical transcription system.  Any errors from dictation are unintentional.

## 2019-03-15 NOTE — Assessment & Plan Note (Signed)
The patient has mild extracranial carotid artery stenosis with significant intracranial disease.  No role for intervention for this.  He certainly needs better blood pressure control and I have refilled his prescription for amlodipine and carvedilol today.  Also would recommend he remain on Plavix and a statin agent and have given him new prescriptions for Crestor and Plavix today.  I will plan a follow-up ultrasound in 3 months for continued evaluation of his carotid disease.  I discussed the pathophysiology and natural history of carotid disease.  I have voiced why it is important and discussed the reason and rationale for continued evaluation.

## 2019-03-15 NOTE — Assessment & Plan Note (Signed)
No role for surgical or interventional therapy at this time.  Recommend blood pressure control, dual antiplatelet therapy, and a statin agent.

## 2019-03-15 NOTE — Assessment & Plan Note (Signed)
None of significance since his stroke/TIA back about 3 months ago.

## 2019-03-15 NOTE — Assessment & Plan Note (Signed)
The patient's blood pressure is not adequately controlled and he has not been taking his medicines because he ran out.  I have given him a new prescription for amlodipine and carvedilol today, but have told him I am not a primary care physician and cannot continue to fill these going forward.  I strongly recommend he find a primary care physician will be happy to try to arrange a referral if he has a preference.  He says he wants to look in [place] and see if he can find a doctor there which is certainly reasonable.  I have given him 3 refills so that should hold off until he can establish care with a primary care physician

## 2019-06-21 ENCOUNTER — Encounter (INDEPENDENT_AMBULATORY_CARE_PROVIDER_SITE_OTHER): Payer: Medicare HMO

## 2019-06-21 ENCOUNTER — Ambulatory Visit (INDEPENDENT_AMBULATORY_CARE_PROVIDER_SITE_OTHER): Payer: Medicare HMO | Admitting: Nurse Practitioner

## 2021-04-09 ENCOUNTER — Encounter: Payer: Self-pay | Admitting: Radiology

## 2021-04-09 ENCOUNTER — Emergency Department
Admission: EM | Admit: 2021-04-09 | Discharge: 2021-04-09 | Disposition: A | Discharge status: Still In Hospital | Payer: Medicare HMO | Source: Home / Self Care

## 2021-04-09 ENCOUNTER — Emergency Department: Payer: Medicare HMO

## 2021-04-09 ENCOUNTER — Emergency Department
Admission: EM | Admit: 2021-04-09 | Discharge: 2021-04-09 | Disposition: A | Discharge status: Home / Self Care | Payer: Medicare HMO | Attending: Emergency Medicine | Admitting: Emergency Medicine

## 2021-04-09 ENCOUNTER — Other Ambulatory Visit: Payer: Self-pay

## 2021-04-09 DIAGNOSIS — R0603 Acute respiratory distress: Secondary | ICD-10-CM

## 2021-04-09 DIAGNOSIS — Z20822 Contact with and (suspected) exposure to covid-19: Secondary | ICD-10-CM | POA: Diagnosis not present

## 2021-04-09 DIAGNOSIS — I10 Essential (primary) hypertension: Secondary | ICD-10-CM | POA: Diagnosis not present

## 2021-04-09 DIAGNOSIS — R0902 Hypoxemia: Secondary | ICD-10-CM | POA: Diagnosis not present

## 2021-04-09 DIAGNOSIS — R7989 Other specified abnormal findings of blood chemistry: Secondary | ICD-10-CM | POA: Insufficient documentation

## 2021-04-09 DIAGNOSIS — R0602 Shortness of breath: Secondary | ICD-10-CM | POA: Diagnosis present

## 2021-04-09 DIAGNOSIS — R61 Generalized hyperhidrosis: Secondary | ICD-10-CM | POA: Diagnosis not present

## 2021-04-09 HISTORY — DX: Essential (primary) hypertension: I10

## 2021-04-09 LAB — COMPREHENSIVE METABOLIC PANEL
ALT: 46 U/L — ABNORMAL HIGH (ref 0–44)
AST: 38 U/L (ref 15–41)
Albumin: 4.3 g/dL (ref 3.5–5.0)
Alkaline Phosphatase: 61 U/L (ref 38–126)
Anion gap: 7 (ref 5–15)
BUN: 22 mg/dL (ref 8–23)
CO2: 24 mmol/L (ref 22–32)
Calcium: 9.1 mg/dL (ref 8.9–10.3)
Chloride: 109 mmol/L (ref 98–111)
Creatinine, Ser: 1.23 mg/dL (ref 0.61–1.24)
GFR, Estimated: 39 mL/min — ABNORMAL LOW (ref >60)
Glucose, Bld: 144 mg/dL — ABNORMAL HIGH (ref 70–99)
Potassium: 3.8 mmol/L (ref 3.5–5.1)
Sodium: 140 mmol/L (ref 135–145)
Total Bilirubin: 0.9 mg/dL (ref 0.3–1.2)
Total Protein: 7.8 g/dL (ref 6.5–8.1)

## 2021-04-09 LAB — MAGNESIUM: Magnesium: 2.1 mg/dL (ref 1.7–2.4)

## 2021-04-09 LAB — BRAIN NATRIURETIC PEPTIDE: B Natriuretic Peptide: 29.6 pg/mL (ref 0.0–100.0)

## 2021-04-09 LAB — D-DIMER, QUANTITATIVE: D-Dimer, Quant: 1.65 ug/mL-FEU — ABNORMAL HIGH (ref 0.00–0.50)

## 2021-04-09 LAB — BLOOD GAS, VENOUS
Acid-base deficit: 0.5 mmol/L (ref 0.0–2.0)
Bicarbonate: 24.2 mmol/L (ref 20.0–28.0)
O2 Saturation: 100 %
Patient temperature: 37
pCO2, Ven: 39 mmHg — ABNORMAL LOW (ref 44–60)
pH, Ven: 7.4 (ref 7.25–7.43)
pO2, Ven: 191 mmHg — ABNORMAL HIGH (ref 32–45)

## 2021-04-09 LAB — CBC WITH DIFFERENTIAL/PLATELET
Abs Immature Granulocytes: 0.01 10*3/uL (ref 0.00–0.07)
Basophils Absolute: 0 10*3/uL (ref 0.0–0.1)
Basophils Relative: 0 %
Eosinophils Absolute: 0 10*3/uL (ref 0.0–0.5)
Eosinophils Relative: 0 %
HCT: 41.8 % (ref 39.0–52.0)
Hemoglobin: 14.3 g/dL (ref 13.0–17.0)
Immature Granulocytes: 0 %
Lymphocytes Relative: 35 %
Lymphs Abs: 2.6 10*3/uL (ref 0.7–4.0)
MCH: 30.3 pg (ref 26.0–34.0)
MCHC: 34.2 g/dL (ref 30.0–36.0)
MCV: 88.6 fL (ref 80.0–100.0)
Monocytes Absolute: 0.2 10*3/uL (ref 0.1–1.0)
Monocytes Relative: 3 %
Neutro Abs: 4.4 10*3/uL (ref 1.7–7.7)
Neutrophils Relative %: 62 %
Platelets: 254 10*3/uL (ref 150–400)
RBC: 4.72 MIL/uL (ref 4.22–5.81)
RDW: 12.3 % (ref 11.5–15.5)
WBC: 7.2 10*3/uL (ref 4.0–10.5)
nRBC: 0 % (ref 0.0–0.2)

## 2021-04-09 LAB — RESP PANEL BY RT-PCR (FLU A&B, COVID) ARPGX2
Influenza A by PCR: NEGATIVE
Influenza B by PCR: NEGATIVE
SARS Coronavirus 2 by RT PCR: NEGATIVE

## 2021-04-09 LAB — TROPONIN I (HIGH SENSITIVITY)
Troponin I (High Sensitivity): 7 ng/L (ref <18)
Troponin I (High Sensitivity): 8 ng/L (ref <18)

## 2021-04-09 MED ORDER — IOHEXOL 350 MG/ML SOLN
75.0000 mL | Freq: Once | INTRAVENOUS | Status: AC | PRN
Start: 2021-04-09 — End: 2021-04-09
  Administered 2021-04-09: 75 mL via INTRAVENOUS

## 2021-04-09 NOTE — ED Notes (Signed)
O2 taken off pt and pt up ambulated around the room without difficulty; O2 sats remained around 97% on RA; pt denies any sob or dizziness; EDP made aware ?

## 2021-04-09 NOTE — ED Triage Notes (Signed)
[place] with complaints of difficulty breathing. EMS states he was diaphoretic with a Spo2 of 82 % on room air. Pt arrives on bipap. ?

## 2021-04-09 NOTE — ED Notes (Signed)
Pt taken off bipap and placed on O2 at [facility]L [place] ?

## 2021-04-09 NOTE — ED Provider Notes (Signed)
? ?[facility] ?Provider Note ? ? ? Event Date/Time  ? First MD Initiated Contact with Patient 04/09/21 1801   ?  (approximate) ? ? ?History  ? ?Respiratory Distress ? ? ?HPI ? ?Albert Leon is a 71 y.o. male who presents to the ED for evaluation of Respiratory Distress ?  ?History of HTN and HLD. ? ?Patient presents to the ED via EMS from a local [employer] store for evaluation of shortness of breath and hypoxia.  EMS found him sitting, diaphoretic and complaining of dyspnea.  Initial sats of 82% he was brought to the ED on CPAP.  First blood pressure was systolics 130s and 140s.  They provided Nitropaste to the chest. ? ?On arrival to the ED, on CPAP, patient reports feeling better.  Reports resolving dyspnea.  Denies any chest pain throughout all of this, just dyspnea, diaphoresis. ?Reports he was walking this morning to the convenient store, before this he was ambulatory at his baseline without exertional dyspnea or exertional chest pain. ? ? ?Physical Exam  ? ?Triage Vital Signs: ?ED Triage Vitals  ?Enc Vitals Group  ?   BP 04/09/21 1800 101/76  ?   Pulse Rate 04/09/21 1800 65  ?   Resp 04/09/21 1800 (!) 22  ?   Temp --   ?   Temp src --   ?   SpO2 04/09/21 1752 99 %  ?   Weight 04/09/21 1753 200 lb (90.7 kg)  ?   Height 04/09/21 1753 5\' 6"  (1.676 m)  ?   Head Circumference --   ?   Peak Flow --   ?   Pain Score --   ?   Pain Loc --   ?   Pain Edu? --   ?   Excl. in GC? --   ? ? ?Most recent vital signs: ?Vitals:  ? 04/09/21 2000 04/09/21 2100  ?BP: 118/76 (!) 144/69  ?Pulse: 62 69  ?Resp: 14 (!) 23  ?SpO2: 100% 100%  ? ? ?General: On initial presentation, he is sitting upright on EMS CPAP.  Somewhat tachypneic, but able to converse through the mask. ?CV:  Good peripheral perfusion. RRR ?Resp:  Normal effort.  Clear lungs.  No wheezing. ?Abd:  No distention.  ?MSK:  No deformity noted.  No significant edema appreciated ?Neuro:  No focal deficits appreciated. ?Other:   ? ? ?ED Results  / Procedures / Treatments  ? ?Labs ?(all labs ordered are listed, but only abnormal results are displayed) ?Labs Reviewed  ?BLOOD GAS, VENOUS - Abnormal; Notable for the following components:  ?    Result Value  ? pCO2, Ven 39 (*)   ? pO2, Ven 191 (*)   ? All other components within normal limits  ?COMPREHENSIVE METABOLIC PANEL - Abnormal; Notable for the following components:  ? Glucose, Bld 144 (*)   ? ALT 46 (*)   ? GFR, Estimated 39 (*)   ? All other components within normal limits  ?D-DIMER, QUANTITATIVE - Abnormal; Notable for the following components:  ? D-Dimer, Quant 1.65 (*)   ? All other components within normal limits  ?RESP PANEL BY RT-PCR (FLU A&B, COVID) [id number]  ?CBC WITH DIFFERENTIAL/PLATELET  ?BRAIN NATRIURETIC PEPTIDE  ?MAGNESIUM  ?URINALYSIS, ROUTINE W REFLEX MICROSCOPIC  ?TROPONIN I (HIGH SENSITIVITY)  ?TROPONIN I (HIGH SENSITIVITY)  ? ? ?EKG ?Sinus rhythm, rate of 73 bpm.  Normal axis and intervals.  No evidence of acute ischemia. ? ?RADIOLOGY ?CXR reviewed by me without  evidence of acute cardiopulmonary pathology. ?CTA chest reviewed by me without evidence of infiltrate or PE ? ?Official radiology report(s): ?CT Angio Chest PE W and/or Wo Contrast ? ?Result Date: 04/09/2021 ?CLINICAL DATA:  Sudden onset hypoxia, initial encounter EXAM: CT ANGIOGRAPHY CHEST WITH CONTRAST TECHNIQUE: Multidetector CT imaging of the chest was performed using the standard protocol during bolus administration of intravenous contrast. Multiplanar CT image reconstructions and MIPs were obtained to evaluate the vascular anatomy. RADIATION DOSE REDUCTION: This exam was performed according to the departmental dose-optimization program which includes automated exposure control, adjustment of the mA and/or kV according to patient size and/or use of iterative reconstruction technique. CONTRAST:  67mL OMNIPAQUE IOHEXOL 350 MG/ML SOLN COMPARISON:  None. FINDINGS: Cardiovascular: Thoracic aorta demonstrates atherosclerotic  calcifications without aneurysmal dilatation or dissection. No cardiac enlargement is seen. The pulmonary artery shows a normal branching pattern without filling defect to suggest pulmonary embolism. Mediastinum/Nodes: Thoracic inlet is within normal limits. No sizable hilar or mediastinal adenopathy is noted. The esophagus as visualized is within normal limits. Lungs/Pleura: Lungs are well aerated bilaterally. No focal infiltrate or sizable effusion is seen. No sizable parenchymal nodules are noted. Upper Abdomen: There is a 7.0 x 4.5 cm hypodense lesion within the right lobe of the liver inferiorly which has a mean Hounsfield density of 15 and likely represents a simple cyst. No further follow-up is recommended. Remainder of the upper abdomen is within normal limits. Musculoskeletal: Multiple healed right rib fractures are noted. No acute bony abnormality is noted. Review of the MIP images confirms the above findings. IMPRESSION: No evidence of pulmonary emboli. 7 cm hypodense lesion within the right lobe of the liver consistent with a simple cyst based on Hounsfield density. No follow-up is recommended. Aortic Atherosclerosis (ICD10-I70.0). Electronically Signed   By: Inez Catalina M.D.   On: 04/09/2021 21:06  ? ?DG Chest Portable 1 View ? ?Result Date: 04/09/2021 ?CLINICAL DATA:  Hypoxia and diaphoresis. EXAM: PORTABLE CHEST 1 VIEW COMPARISON:  None. FINDINGS: Deformities of the right lateral second, third, fourth, fifth, and sixth ribs compatible with age-indeterminate fractures. No visible pneumothorax or pulmonary contusion. Cardiac and mediastinal margins appear normal. No blunting of the costophrenic angles. Linear subsegmental atelectasis or scarring at the left lung base. IMPRESSION: 1. Deformities of the right lateral second through sixth ribs compatible with age-indeterminate fractures. Correlate with patient history and any tenderness along the right lateral upper rib cage. 2. Mild subsegmental  atelectasis or scarring at the left lung base. Electronically Signed   By: Van Clines M.D.   On: 04/09/2021 18:36   ? ?PROCEDURES and INTERVENTIONS: ? ?.1-3 Lead EKG Interpretation ?Performed by: Vladimir Crofts, MD ?Authorized by: Vladimir Crofts, MD  ? ?  Interpretation: normal   ?  ECG rate:  74 ?  ECG rate assessment: normal   ?  Rhythm: sinus rhythm   ?  Ectopy: none   ?  Conduction: normal   ?.Critical Care ?Performed by: Vladimir Crofts, MD ?Authorized by: Vladimir Crofts, MD  ? ?Critical care provider statement:  ?  Critical care time (minutes):  30 ?  Critical care time was exclusive of:  Separately billable procedures and treating other patients ?  Critical care was necessary to treat or prevent imminent or life-threatening deterioration of the following conditions:  Respiratory failure ?  Critical care was time spent personally by me on the following activities:  Development of treatment plan with patient or surrogate, discussions with consultants, evaluation of patient's response to treatment, examination  of patient, ordering and review of laboratory studies, ordering and review of radiographic studies, ordering and performing treatments and interventions, pulse oximetry, re-evaluation of patient's condition and review of old charts ? ?Medications  ?iohexol (OMNIPAQUE) 350 MG/ML injection 75 mL (75 mLs Intravenous Contrast Given 04/09/21 2035)  ? ? ? ?IMPRESSION / MDM / ASSESSMENT AND PLAN / ED COURSE  ?I reviewed the triage vital signs and the nursing notes. ? ?71 year old male presents to the ED by EMS for shortness of breath and respiratory distress, with a fairly extensive work-up without evidence of acute pathology and ultimately suitable to outpatient management.  He presents on CPAP and slightly tachypneic to the mid 20s, but able to converse.  No signs of volume overload grossly.  His work-up is benign with a nonischemic EKG and 2 negative high-sensitivity troponins.  ACS is unlikely.  Normal  metabolic panel and CBC.  D-dimer is slightly elevated and CTA chest performed without evidence of PE, infiltrate or PTX.  BNP is low.  While initially thought to flash pulmonary edema considering his history of hypertensi

## 2021-04-11 ENCOUNTER — Encounter (INDEPENDENT_AMBULATORY_CARE_PROVIDER_SITE_OTHER): Payer: Self-pay | Admitting: Vascular Surgery
